# Patient Record
Sex: Male | Born: 1995
Health system: Southern US, Community
[De-identification: ages and names within clinical notes are randomized; demographics above are authoritative.]

## PROBLEM LIST (undated history)

## (undated) DIAGNOSIS — J45909 Unspecified asthma, uncomplicated: Secondary | ICD-10-CM

## (undated) DIAGNOSIS — B20 Human immunodeficiency virus [HIV] disease: Secondary | ICD-10-CM

## (undated) DIAGNOSIS — D649 Anemia, unspecified: Secondary | ICD-10-CM

## (undated) HISTORY — DX: Human immunodeficiency virus (HIV) disease: B20

## (undated) HISTORY — DX: Anemia, unspecified: D64.9

## (undated) HISTORY — PX: NO PAST SURGERIES: SHX2092

---
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
## 2016-01-13 MED FILL — ADVAIR 100/50 DISKUS: 100-50 | 90 days supply | Qty: 180 | Fill #0

## 2016-07-27 DIAGNOSIS — Z0100 Encounter for examination of eyes and vision without abnormal findings: Secondary | ICD-10-CM | POA: Diagnosis not present

## 2016-07-27 DIAGNOSIS — Z0000 Encounter for general adult medical examination without abnormal findings: Secondary | ICD-10-CM | POA: Diagnosis not present

## 2016-07-27 DIAGNOSIS — Z0110 Encounter for examination of ears and hearing without abnormal findings: Secondary | ICD-10-CM | POA: Diagnosis not present

## 2016-11-03 MED FILL — CHLORHEXIDINE 0.12% RINSE: 0.12 | 15 days supply | Qty: 473 | Fill #0

## 2016-11-04 MED FILL — ADVAIR 100/50 DISKUS: 100-50 | 90 days supply | Qty: 180 | Fill #1

## 2017-03-23 MED FILL — ADVAIR 100/50 DISKUS: 100-50 | 90 days supply | Qty: 180 | Fill #0

## 2017-03-29 DIAGNOSIS — H47091 Other disorders of optic nerve, not elsewhere classified, right eye: Secondary | ICD-10-CM | POA: Diagnosis not present

## 2017-03-29 DIAGNOSIS — H5213 Myopia, bilateral: Secondary | ICD-10-CM | POA: Diagnosis not present

## 2017-03-29 DIAGNOSIS — H52222 Regular astigmatism, left eye: Secondary | ICD-10-CM | POA: Diagnosis not present

## 2018-02-02 DIAGNOSIS — Z23 Encounter for immunization: Secondary | ICD-10-CM | POA: Diagnosis not present

## 2018-02-02 DIAGNOSIS — Z1389 Encounter for screening for other disorder: Secondary | ICD-10-CM | POA: Diagnosis not present

## 2018-02-02 DIAGNOSIS — Z0000 Encounter for general adult medical examination without abnormal findings: Secondary | ICD-10-CM | POA: Diagnosis not present

## 2018-02-02 MED FILL — ADVAIR 100/50 DISKUS: 100-50 | 30 days supply | Qty: 60 | Fill #0

## 2018-03-17 DIAGNOSIS — H6123 Impacted cerumen, bilateral: Secondary | ICD-10-CM | POA: Diagnosis not present

## 2018-03-17 DIAGNOSIS — Z0000 Encounter for general adult medical examination without abnormal findings: Secondary | ICD-10-CM | POA: Diagnosis not present

## 2018-03-17 DIAGNOSIS — J45909 Unspecified asthma, uncomplicated: Secondary | ICD-10-CM | POA: Diagnosis not present

## 2018-03-17 DIAGNOSIS — Z23 Encounter for immunization: Secondary | ICD-10-CM | POA: Diagnosis not present

## 2018-04-04 MED FILL — ADVAIR 100/50 DISKUS: 100-50 | 30 days supply | Qty: 60 | Fill #1

## 2018-04-26 DIAGNOSIS — H5213 Myopia, bilateral: Secondary | ICD-10-CM | POA: Diagnosis not present

## 2018-05-22 MED FILL — ADVAIR 100/50 DISKUS: 100-50 | 30 days supply | Qty: 60 | Fill #2

## 2018-06-21 DIAGNOSIS — H16042 Marginal corneal ulcer, left eye: Secondary | ICD-10-CM | POA: Diagnosis not present

## 2018-06-22 MED FILL — ZYLET EYE DROPS: 0.5-0.3 | 25 days supply | Qty: 5 | Fill #0

## 2018-08-08 MED FILL — ADVAIR 100/50 DISKUS: 100-50 | 30 days supply | Qty: 60 | Fill #3

## 2018-10-25 MED FILL — ADVAIR 100/50 DISKUS: 100-50 | 30 days supply | Qty: 60 | Fill #0

## 2018-10-26 DIAGNOSIS — J0100 Acute maxillary sinusitis, unspecified: Secondary | ICD-10-CM | POA: Diagnosis not present

## 2018-10-26 DIAGNOSIS — R6889 Other general symptoms and signs: Secondary | ICD-10-CM | POA: Diagnosis not present

## 2018-10-26 DIAGNOSIS — I889 Nonspecific lymphadenitis, unspecified: Secondary | ICD-10-CM | POA: Diagnosis not present

## 2018-10-27 MED FILL — AMOXICILLIN 500 MG CAPSULE: 500 | 7 days supply | Qty: 21 | Fill #0

## 2018-11-02 ENCOUNTER — Other Ambulatory Visit: Payer: Self-pay

## 2018-11-02 ENCOUNTER — Emergency Department (HOSPITAL_BASED_OUTPATIENT_CLINIC_OR_DEPARTMENT_OTHER): Payer: 59

## 2018-11-02 ENCOUNTER — Encounter (HOSPITAL_BASED_OUTPATIENT_CLINIC_OR_DEPARTMENT_OTHER): Payer: Self-pay | Admitting: *Deleted

## 2018-11-02 ENCOUNTER — Emergency Department (HOSPITAL_BASED_OUTPATIENT_CLINIC_OR_DEPARTMENT_OTHER)
Admission: EM | Admit: 2018-11-02 | Discharge: 2018-11-03 | Disposition: A | Discharge status: Home / Self Care | Payer: 59 | Attending: Emergency Medicine | Admitting: Emergency Medicine

## 2018-11-02 DIAGNOSIS — R509 Fever, unspecified: Secondary | ICD-10-CM | POA: Insufficient documentation

## 2018-11-02 DIAGNOSIS — F17200 Nicotine dependence, unspecified, uncomplicated: Secondary | ICD-10-CM | POA: Insufficient documentation

## 2018-11-02 DIAGNOSIS — J45909 Unspecified asthma, uncomplicated: Secondary | ICD-10-CM | POA: Diagnosis not present

## 2018-11-02 HISTORY — DX: Unspecified asthma, uncomplicated: J45.909

## 2018-11-02 LAB — COMPREHENSIVE METABOLIC PANEL
ALT: 49 U/L — ABNORMAL HIGH (ref 0–44)
AST: 48 U/L — ABNORMAL HIGH (ref 15–41)
Albumin: 3.6 g/dL (ref 3.5–5.0)
Alkaline Phosphatase: 53 U/L (ref 38–126)
Anion gap: 6 (ref 5–15)
BUN: 10 mg/dL (ref 6–20)
CO2: 26 mmol/L (ref 22–32)
Calcium: 8.7 mg/dL — ABNORMAL LOW (ref 8.9–10.3)
Chloride: 100 mmol/L (ref 98–111)
Creatinine, Ser: 0.95 mg/dL (ref 0.61–1.24)
GFR calc Af Amer: >60 mL/min (ref >60)
GFR calc non Af Amer: >60 mL/min (ref >60)
Glucose, Bld: 93 mg/dL (ref 70–99)
Potassium: 3.2 mmol/L — ABNORMAL LOW (ref 3.5–5.1)
Sodium: 132 mmol/L — ABNORMAL LOW (ref 135–145)
Total Bilirubin: 0.6 mg/dL (ref 0.3–1.2)
Total Protein: 6.6 g/dL (ref 6.5–8.1)

## 2018-11-02 LAB — URINALYSIS, ROUTINE W REFLEX MICROSCOPIC
Glucose, UA: NEGATIVE mg/dL
Hgb urine dipstick: NEGATIVE
Ketones, ur: 40 mg/dL — AB
Leukocytes, UA: NEGATIVE
Nitrite: NEGATIVE
Protein, ur: NEGATIVE mg/dL
Specific Gravity, Urine: 1.015 (ref 1.005–1.030)
pH: 5.5 (ref 5.0–8.0)

## 2018-11-02 LAB — CBC WITH DIFFERENTIAL/PLATELET
Abs Immature Granulocytes: 0.03 10*3/uL (ref 0.00–0.07)
Basophils Absolute: 0 10*3/uL (ref 0.0–0.1)
Basophils Relative: 1 %
Eosinophils Absolute: 0.1 10*3/uL (ref 0.0–0.5)
Eosinophils Relative: 4 %
HCT: 40.5 % (ref 39.0–52.0)
Hemoglobin: 12.5 g/dL — ABNORMAL LOW (ref 13.0–17.0)
Immature Granulocytes: 1 %
Lymphocytes Relative: 32 %
Lymphs Abs: 0.8 10*3/uL (ref 0.7–4.0)
MCH: 28.5 pg (ref 26.0–34.0)
MCHC: 30.9 g/dL (ref 30.0–36.0)
MCV: 92.5 fL (ref 80.0–100.0)
Monocytes Absolute: 0.2 10*3/uL (ref 0.1–1.0)
Monocytes Relative: 6 %
Neutro Abs: 1.5 10*3/uL — ABNORMAL LOW (ref 1.7–7.7)
Neutrophils Relative %: 56 %
Platelet Morphology: NORMAL
Platelets: 305 10*3/uL (ref 150–400)
RBC: 4.38 MIL/uL (ref 4.22–5.81)
RDW: 10.9 % — ABNORMAL LOW (ref 11.5–15.5)
WBC: 2.6 10*3/uL — ABNORMAL LOW (ref 4.0–10.5)
nRBC: 0 % (ref 0.0–0.2)

## 2018-11-02 LAB — CK: Total CK: 71 U/L (ref 49–397)

## 2018-11-02 MED ORDER — ACETAMINOPHEN 325 MG PO TABS
650.0000 mg | ORAL_TABLET | Freq: Once | ORAL | Status: AC
  Administered 2018-11-02: 650 mg via ORAL
  Filled 2018-11-02: qty 2

## 2018-11-02 MED ORDER — POTASSIUM CHLORIDE CRYS ER 20 MEQ PO TBCR
40.0000 meq | EXTENDED_RELEASE_TABLET | Freq: Once | ORAL | Status: AC
  Administered 2018-11-03: 40 meq via ORAL
  Filled 2018-11-02: qty 2

## 2018-11-02 MED ORDER — KETOROLAC TROMETHAMINE 15 MG/ML IJ SOLN
15.0000 mg | Freq: Once | INTRAMUSCULAR | Status: AC
  Administered 2018-11-02: 15 mg via INTRAVENOUS
  Filled 2018-11-02: qty 1

## 2018-11-02 MED ORDER — SODIUM CHLORIDE 0.9 % IV BOLUS
1000.0000 mL | Freq: Once | INTRAVENOUS | Status: AC
  Administered 2018-11-02: 1000 mL via INTRAVENOUS

## 2018-11-02 NOTE — ED Provider Notes (Signed)
[facility] EMERGENCY DEPARTMENT Provider Note   CSN: [id number] Arrival date & time: 11/02/18  [id number]     History   Chief Complaint Chief Complaint  Patient presents with  . Abdominal Pain    HPI Annye RuskLevon Flegel is a 22 y.o. male.  HPI   22 year old male with fever, generalized fatigue body aches.  He reports that symptoms began actually over 2 weeks ago.  This is his evaluated by his PCP and prescribed amoxicillin.  He is almost done with the course of this with no improvement of symptoms.  Occasionally has a "bubbling feeling" in his abdomen.  This improves with burping.  Does not really feel nauseated.  No vomiting or diarrhea.  No rash.  No weight loss.  No cough or shortness of breath.  Denies myalgias.  No weight loss.  History of asthma, otherwise healthy.  He has never been incarcerated.  Denies any past history of IV drug use.  Past Medical History:  Diagnosis Date  . Asthma     There are no active problems to display for this patient.   History reviewed. No pertinent surgical history.      Home Medications    Prior to Admission medications   Medication Sig Start Date End Date Taking? Authorizing Provider  Fluticasone-Salmeterol (ADVAIR DISKUS IN) Inhale into the lungs.   Yes [provider]    Family History No family history on file.  Social History Social History   Tobacco Use  . Smoking status: Current Every Day Smoker  . Smokeless tobacco: Never Used  Substance Use Topics  . Alcohol use: Never    Frequency: Never  . Drug use: Never     Allergies   Patient has no known allergies.   Review of Systems Review of Systems  All systems reviewed and negative, other than as noted in HPI.  Physical Exam Updated Vital Signs BP 118/64 (BP Location: Right Arm)   Pulse 83   Temp 100.2 F (37.9 C) (Oral)   Resp 18   Ht 5\' 8"  (1.727 m)   Wt 65 kg   SpO2 100%   BMI 21.79 kg/m   Physical Exam Vitals signs and nursing  note reviewed.  Constitutional:      General: He is not in acute distress.    Appearance: He is well-developed. He is not ill-appearing or toxic-appearing.  HENT:     Head: Normocephalic and atraumatic.  Eyes:     General:        Right eye: No discharge.        Left eye: No discharge.     Conjunctiva/sclera: Conjunctivae normal.  Neck:     Musculoskeletal: Neck supple.  Cardiovascular:     Rate and Rhythm: Regular rhythm.     Heart sounds: Normal heart sounds. No murmur. No friction rub. No gallop.      Comments: Mild tachycardia Pulmonary:     Effort: Pulmonary effort is normal. No respiratory distress.     Breath sounds: Normal breath sounds.  Abdominal:     General: There is no distension.     Palpations: Abdomen is soft.     Tenderness: There is no abdominal tenderness.  Musculoskeletal:        General: No tenderness.  Skin:    General: Skin is warm and dry.  Neurological:     Mental Status: He is alert.  Psychiatric:        Behavior: Behavior normal.  Thought Content: Thought content normal.      ED Treatments / Results  Labs (all labs ordered are listed, but only abnormal results are displayed) Labs Reviewed  URINALYSIS, ROUTINE W REFLEX MICROSCOPIC - Abnormal; Notable for the following components:      Result Value   Color, Urine ORANGE (*)    Bilirubin Urine SMALL (*)    Ketones, ur 40 (*)    All other components within normal limits  CBC WITH DIFFERENTIAL/PLATELET - Abnormal; Notable for the following components:   WBC 2.6 (*)    Hemoglobin 12.5 (*)    RDW 10.9 (*)    Neutro Abs 1.5 (*)    All other components within normal limits  COMPREHENSIVE METABOLIC PANEL - Abnormal; Notable for the following components:   Sodium 132 (*)    Potassium 3.2 (*)    Calcium 8.7 (*)    AST 48 (*)    ALT 49 (*)    All other components within normal limits  CULTURE, BLOOD (ROUTINE X 2)  CULTURE, BLOOD (ROUTINE X 2)  INFLUENZA PANEL BY PCR (TYPE A & B)  CK    RHEUMATOID FACTOR  SEDIMENTATION RATE  C-REACTIVE PROTEIN  ANTINUCLEAR ANTIBODIES, IFA  HIV ANTIBODY (ROUTINE TESTING W REFLEX)    EKG None  Radiology Dg Chest 2 View  Result Date: 11/02/2018 CLINICAL DATA:  Fever for 2 weeks, abdominal pain and headache. EXAM: CHEST - 2 VIEW COMPARISON:  None. FINDINGS: Cardiomediastinal silhouette is normal. No pleural effusions or focal consolidations. Trachea projects midline and there is no pneumothorax. Soft tissue planes and included osseous structures are non-suspicious. Mild lower thoracic levoscoliosis. IMPRESSION: Negative. Electronically Signed   By: Awilda Metroourtnay  Bloomer M.D.   On: 11/02/2018 23:07    Procedures Procedures (including critical care time)  Medications Ordered in ED Medications  sodium chloride 0.9 % bolus 1,000 mL (1,000 mLs Intravenous New Bag/Given 11/02/18 2301)  acetaminophen (TYLENOL) tablet 650 mg (650 mg Oral Given 11/02/18 2120)  ketorolac (TORADOL) 15 MG/ML injection 15 mg (15 mg Intravenous Given 11/02/18 2300)     Initial Impression / Assessment and Plan / ED Course  I have reviewed the triage vital signs and the nursing notes.  Pertinent labs & imaging results that were available during my care of the patient were reviewed by me and considered in my medical decision making (see chart for details).     22yM with fever for over two weeks. Actually looks quite well. Finished course of abx without improvement. Unclear source of fever. Basic labs unremarkable. Additional studies sent. PT understands the need ot have these followed up.   It has been determined that no acute conditions requiring further emergency intervention are present at this time. The patient has been advised of the diagnosis and plan. I reviewed any labs and imaging including any potential incidental findings. We have discussed signs and symptoms that warrant return to the ED and they are listed in the discharge instructions.     Final  Clinical Impressions(s) / ED Diagnoses   Final diagnoses:  Fever, unspecified fever cause    ED Discharge Orders    None       Raeford RazorKohut, Sandhya Denherder, MD 11/04/18 [phone]

## 2018-11-02 NOTE — ED Triage Notes (Signed)
Abdominal pain and headache for over a week. He was seen by his MD and took a round of antibiotics with no improvement. He has had a negative flu test.

## 2018-11-03 DIAGNOSIS — F17200 Nicotine dependence, unspecified, uncomplicated: Secondary | ICD-10-CM | POA: Diagnosis not present

## 2018-11-03 DIAGNOSIS — J45909 Unspecified asthma, uncomplicated: Secondary | ICD-10-CM | POA: Diagnosis not present

## 2018-11-03 DIAGNOSIS — R509 Fever, unspecified: Secondary | ICD-10-CM | POA: Diagnosis not present

## 2018-11-03 LAB — INFLUENZA PANEL BY PCR (TYPE A & B)
Influenza A By PCR: NEGATIVE
Influenza B By PCR: NEGATIVE

## 2018-11-03 LAB — SEDIMENTATION RATE: Sed Rate: 8 mm/hr (ref 0–16)

## 2018-11-03 LAB — C-REACTIVE PROTEIN: CRP: <0.8 mg/dL (ref <1.0)

## 2018-11-04 LAB — RHEUMATOID FACTOR: Rheumatoid fact SerPl-aCnc: <10 IU/mL (ref 0.0–13.9)

## 2018-11-06 LAB — ANTINUCLEAR ANTIBODIES, IFA: ANA Ab, IFA: NEGATIVE

## 2018-11-08 LAB — CULTURE, BLOOD (ROUTINE X 2)
Culture: NO GROWTH
Culture: NO GROWTH
Special Requests: ADEQUATE
Special Requests: ADEQUATE

## 2018-11-09 ENCOUNTER — Ambulatory Visit
Admission: RE | Admit: 2018-11-09 | Discharge: 2018-11-09 | Disposition: A | Discharge status: Home / Self Care | Payer: 59 | Source: Ambulatory Visit | Attending: Internal Medicine | Admitting: Internal Medicine

## 2018-11-09 ENCOUNTER — Emergency Department (HOSPITAL_COMMUNITY)
Admission: EM | Admit: 2018-11-09 | Discharge: 2018-11-10 | Disposition: A | Discharge status: Home / Self Care | Payer: No Typology Code available for payment source | Attending: Emergency Medicine | Admitting: Emergency Medicine

## 2018-11-09 ENCOUNTER — Other Ambulatory Visit: Payer: Self-pay | Admitting: Internal Medicine

## 2018-11-09 ENCOUNTER — Encounter (HOSPITAL_COMMUNITY): Payer: Self-pay

## 2018-11-09 DIAGNOSIS — R109 Unspecified abdominal pain: Secondary | ICD-10-CM

## 2018-11-09 DIAGNOSIS — Z5321 Procedure and treatment not carried out due to patient leaving prior to being seen by health care provider: Secondary | ICD-10-CM | POA: Insufficient documentation

## 2018-11-09 DIAGNOSIS — R531 Weakness: Secondary | ICD-10-CM | POA: Diagnosis present

## 2018-11-09 LAB — CBC
HCT: 32.8 % — ABNORMAL LOW (ref 39.0–52.0)
Hemoglobin: 11.4 g/dL — ABNORMAL LOW (ref 13.0–17.0)
MCH: 33.2 pg (ref 26.0–34.0)
MCHC: 34.8 g/dL (ref 30.0–36.0)
MCV: 95.6 fL (ref 80.0–100.0)
PLATELETS: 316 10*3/uL (ref 150–400)
RBC: 3.43 MIL/uL — ABNORMAL LOW (ref 4.22–5.81)
RDW: 11.5 % (ref 11.5–15.5)
WBC: 5.5 10*3/uL (ref 4.0–10.5)
nRBC: 0.4 % — ABNORMAL HIGH (ref 0.0–0.2)

## 2018-11-09 LAB — COMPREHENSIVE METABOLIC PANEL
ALT: 739 U/L — ABNORMAL HIGH (ref 0–44)
AST: 569 U/L — ABNORMAL HIGH (ref 15–41)
Albumin: 3.6 g/dL (ref 3.5–5.0)
Alkaline Phosphatase: 89 U/L (ref 38–126)
Anion gap: 11 (ref 5–15)
BUN: 12 mg/dL (ref 6–20)
CALCIUM: 9.1 mg/dL (ref 8.9–10.3)
CO2: 25 mmol/L (ref 22–32)
Chloride: 102 mmol/L (ref 98–111)
Creatinine, Ser: 0.95 mg/dL (ref 0.61–1.24)
GFR calc Af Amer: >60 mL/min (ref >60)
GFR calc non Af Amer: >60 mL/min (ref >60)
Glucose, Bld: 99 mg/dL (ref 70–99)
Potassium: 4.6 mmol/L (ref 3.5–5.1)
Sodium: 138 mmol/L (ref 135–145)
Total Bilirubin: 1.7 mg/dL — ABNORMAL HIGH (ref 0.3–1.2)
Total Protein: 7.3 g/dL (ref 6.5–8.1)

## 2018-11-09 LAB — URINALYSIS, ROUTINE W REFLEX MICROSCOPIC
Bilirubin Urine: NEGATIVE
Glucose, UA: NEGATIVE mg/dL
Hgb urine dipstick: NEGATIVE
Ketones, ur: 5 mg/dL — AB
Leukocytes, UA: NEGATIVE
Nitrite: NEGATIVE
Protein, ur: NEGATIVE mg/dL
Specific Gravity, Urine: 1.019 (ref 1.005–1.030)
pH: 5 (ref 5.0–8.0)

## 2018-11-09 LAB — LIPASE, BLOOD: Lipase: 29 U/L (ref 11–51)

## 2018-11-09 NOTE — ED Triage Notes (Signed)
Pt reports continued generalized weakness and nausea fot the past 3 weeks. Was seen by PCP today and was sent here due to elevated liver function tests, possible hepatitis. Pt endorsing fevers at home and some diarrhea.

## 2018-11-10 ENCOUNTER — Emergency Department (HOSPITAL_COMMUNITY)
Admission: EM | Admit: 2018-11-10 | Discharge: 2018-11-10 | Disposition: A | Discharge status: Home / Self Care | Payer: No Typology Code available for payment source | Source: Home / Self Care | Attending: Emergency Medicine | Admitting: Emergency Medicine

## 2018-11-10 ENCOUNTER — Ambulatory Visit (HOSPITAL_COMMUNITY): Payer: No Typology Code available for payment source

## 2018-11-10 ENCOUNTER — Telehealth (HOSPITAL_BASED_OUTPATIENT_CLINIC_OR_DEPARTMENT_OTHER): Payer: Self-pay | Admitting: *Deleted

## 2018-11-10 DIAGNOSIS — R7401 Elevation of levels of liver transaminase levels: Secondary | ICD-10-CM

## 2018-11-10 DIAGNOSIS — R740 Nonspecific elevation of levels of transaminase and lactic acid dehydrogenase [LDH]: Secondary | ICD-10-CM

## 2018-11-10 DIAGNOSIS — B20 Human immunodeficiency virus [HIV] disease: Secondary | ICD-10-CM

## 2018-11-10 LAB — CBC WITH DIFFERENTIAL/PLATELET
Abs Immature Granulocytes: 0 10*3/uL (ref 0.00–0.07)
Basophils Absolute: 0 10*3/uL (ref 0.0–0.1)
Basophils Relative: 0 %
Eosinophils Absolute: 0 10*3/uL (ref 0.0–0.5)
Eosinophils Relative: 0 %
HEMATOCRIT: 33.9 % — AB (ref 39.0–52.0)
Hemoglobin: 11.2 g/dL — ABNORMAL LOW (ref 13.0–17.0)
Lymphocytes Relative: 48 %
Lymphs Abs: 2.4 10*3/uL (ref 0.7–4.0)
MCH: 31.6 pg (ref 26.0–34.0)
MCHC: 33 g/dL (ref 30.0–36.0)
MCV: 95.8 fL (ref 80.0–100.0)
MONOS PCT: 9 %
Monocytes Absolute: 0.5 10*3/uL (ref 0.1–1.0)
Neutro Abs: 2.2 10*3/uL (ref 1.7–7.7)
Neutrophils Relative %: 43 %
Platelets: 304 10*3/uL (ref 150–400)
RBC: 3.54 MIL/uL — ABNORMAL LOW (ref 4.22–5.81)
RDW: 11.5 % (ref 11.5–15.5)
WBC: 5.1 10*3/uL (ref 4.0–10.5)
nRBC: 0.6 % — ABNORMAL HIGH (ref 0.0–0.2)

## 2018-11-10 LAB — URINALYSIS, ROUTINE W REFLEX MICROSCOPIC
Bilirubin Urine: NEGATIVE
Glucose, UA: NEGATIVE mg/dL
Hgb urine dipstick: NEGATIVE
KETONES UR: 5 mg/dL — AB
Leukocytes, UA: NEGATIVE
Nitrite: NEGATIVE
Protein, ur: NEGATIVE mg/dL
Specific Gravity, Urine: 1.019 (ref 1.005–1.030)
pH: 5 (ref 5.0–8.0)

## 2018-11-10 LAB — COMPREHENSIVE METABOLIC PANEL
ALT: 822 U/L — ABNORMAL HIGH (ref 0–44)
AST: 656 U/L — ABNORMAL HIGH (ref 15–41)
Albumin: 3.5 g/dL (ref 3.5–5.0)
Alkaline Phosphatase: 95 U/L (ref 38–126)
Anion gap: 9 (ref 5–15)
BILIRUBIN TOTAL: 1.8 mg/dL — AB (ref 0.3–1.2)
BUN: 12 mg/dL (ref 6–20)
CO2: 25 mmol/L (ref 22–32)
Calcium: 8.8 mg/dL — ABNORMAL LOW (ref 8.9–10.3)
Chloride: 101 mmol/L (ref 98–111)
Creatinine, Ser: 0.96 mg/dL (ref 0.61–1.24)
Glucose, Bld: 129 mg/dL — ABNORMAL HIGH (ref 70–99)
Potassium: 3.6 mmol/L (ref 3.5–5.1)
Sodium: 135 mmol/L (ref 135–145)
TOTAL PROTEIN: 7 g/dL (ref 6.5–8.1)

## 2018-11-10 LAB — HIV ANTIBODY (ROUTINE TESTING W REFLEX): HIV Screen 4th Generation wRfx: REACTIVE — AB

## 2018-11-10 LAB — LIPASE, BLOOD: Lipase: 38 U/L (ref 11–51)

## 2018-11-10 LAB — ACETAMINOPHEN LEVEL: Acetaminophen (Tylenol), Serum: <10 ug/mL — ABNORMAL LOW (ref 10–30)

## 2018-11-10 LAB — HIV 1/2 AB DIFFERENTIATION
HIV 1 Ab: UNDETERMINED
HIV 2 Ab: NEGATIVE

## 2018-11-10 MED ORDER — SODIUM CHLORIDE 0.9 % IV SOLN
1000.0000 mL | Freq: Once | INTRAVENOUS | Status: AC
  Administered 2018-11-10: 1000 mL via INTRAVENOUS

## 2018-11-10 NOTE — ED Notes (Signed)
Patient transported to Ultrasound 

## 2018-11-10 NOTE — ED Notes (Signed)
Patient verbalizes understanding of discharge instructions. Opportunity for questioning and answers were provided. Pt discharged from ED. 

## 2018-11-10 NOTE — Discharge Instructions (Addendum)
You have been diagnosed with HIV.  Infectious disease specialist Dr. Orvan Falconer will contact you next week for further management once confirmatory test has resulted.  Your liver enzyme is elevated and likely due to HIV infection.  Avoid alcohol or tylenol as it can worsen your condition.  Return if you have any concerns.  Follow up with your primary care provider.

## 2018-11-10 NOTE — ED Triage Notes (Signed)
Pt here from Md office with elevated liver test , pt was here last night but left due to wait

## 2018-11-10 NOTE — ED Notes (Signed)
Pt called x 3 with no answer

## 2018-11-10 NOTE — ED Provider Notes (Signed)
[facility] Provider Note   CSN: [id number] Arrival date & time: 11/10/18  1119     History   Chief Complaint No chief complaint on file.   HPI Douglas Jimenez is a 23 y.o. male.  The history is provided by the patient and medical records. No language interpreter was used.     23 year old male sent here by PCP for abnormal labs.  Patient report 3 weeks ago he developed fever and generalized body aches.  He was initially seen by his PCP and was given amoxicillin.  He finished a full course but states it did not provide any significant improvement.  He was seen in the ED on 12/26 with complaints of abdominal discomfort.  No acute pathology was identified however broad panel of labs was sent.  Patient follow-up with PCP yesterday when he still endorse no improvement of his symptoms.  He was found to have elevated liver enzyme and was recommended to come to ER to be admitted.  He went to the ED yesterday but left without being seen due to the long wait.  His labs liver function worsen and patient returns today for recommendation by PCP to get admitted.  Aside from generalized weakness, patient denies any severe headache, no URI symptoms, no chest pain, shortness of breath, productive cough, abdominal discomfort.  He endorsed mild nausea without vomiting.  He does complain of itchiness throughout his skin.  He denies alcohol abuse.  No recent travel.  No complaints of severe headache.  He does admits to taking Motrin, Benadryl, and Tylenol to help with his symptoms but denies taking more Tylenol than recommended.  Pt does admits to having sexual activities with both sexes.   Past Medical History:  Diagnosis Date  . Asthma     There are no active problems to display for this patient.   No past surgical history on file.      Home Medications    Prior to Admission medications   Medication Sig Start Date End Date Taking? Authorizing Provider    Fluticasone-Salmeterol (ADVAIR DISKUS IN) Inhale into the lungs.    [provider]    Family History No family history on file.  Social History Social History   Tobacco Use  . Smoking status: Current Every Day Smoker  . Smokeless tobacco: Never Used  Substance Use Topics  . Alcohol use: Never    Frequency: Never  . Drug use: Never     Allergies   Amoxicillin   Review of Systems Review of Systems  All other systems reviewed and are negative.    Physical Exam Updated Vital Signs BP 113/67   Pulse 96   Temp 98.8 F (37.1 C)   Resp 16   SpO2 100%   Physical Exam Vitals signs and nursing note reviewed.  Constitutional:      General: He is not in acute distress.    Appearance: He is well-developed.  HENT:     Head: Atraumatic.  Eyes:     General: No scleral icterus.    Conjunctiva/sclera: Conjunctivae normal.  Neck:     Musculoskeletal: Neck supple. No neck rigidity.  Cardiovascular:     Rate and Rhythm: Normal rate and regular rhythm.     Pulses: Normal pulses.     Heart sounds: Normal heart sounds.  Pulmonary:     Effort: Pulmonary effort is normal.     Breath sounds: No wheezing.  Abdominal:     Palpations: Abdomen is soft.  Tenderness: There is no abdominal tenderness.  Skin:    Findings: No rash.  Neurological:     Mental Status: He is alert and oriented to person, place, and time.  Psychiatric:        Mood and Affect: Mood normal.      ED Treatments / Results  Labs (all labs ordered are listed, but only abnormal results are displayed) Labs Reviewed  CBC WITH DIFFERENTIAL/PLATELET - Abnormal; Notable for the following components:      Result Value   RBC 3.54 (*)    Hemoglobin 11.2 (*)    HCT 33.9 (*)    nRBC 0.6 (*)    All other components within normal limits  COMPREHENSIVE METABOLIC PANEL - Abnormal; Notable for the following components:   Glucose, Bld 129 (*)    Calcium 8.8 (*)    AST 656 (*)    ALT 822 (*)     Total Bilirubin 1.8 (*)    All other components within normal limits  URINALYSIS, ROUTINE W REFLEX MICROSCOPIC - Abnormal; Notable for the following components:   Color, Urine AMBER (*)    Ketones, ur 5 (*)    All other components within normal limits  ACETAMINOPHEN LEVEL - Abnormal; Notable for the following components:   Acetaminophen (Tylenol), Serum <10 (*)    All other components within normal limits  LIPASE, BLOOD  HEPATITIS PANEL, ACUTE  HIV-1 RNA, QUALITATIVE, TMA    EKG None  Radiology Dg Abd 2 Views  Result Date: 11/09/2018 CLINICAL DATA:  Acute generalized abdominal pain. EXAM: ABDOMEN - 2 VIEW COMPARISON:  None. FINDINGS: The bowel gas pattern is normal. There is no evidence of free air. No radio-opaque calculi or other significant radiographic abnormality is seen. IMPRESSION: No evidence of bowel obstruction or ileus. Electronically Signed   By: Lupita RaiderJames  Green Jr, M.D.   On: 11/09/2018 11:19    Procedures Procedures (including critical care time)  Medications Ordered in ED Medications  0.9 %  sodium chloride infusion (1,000 mLs Intravenous New Bag/Given 11/10/18 1417)     Initial Impression / Assessment and Plan / ED Course  I have reviewed the triage vital signs and the nursing notes.  Pertinent labs & imaging results that were available during my care of the patient were reviewed by me and considered in my medical decision making (see chart for details).     BP 96/71 (BP Location: Left Arm)   Pulse 88   Temp 98.8 F (37.1 C)   Resp 16   Ht 5\' 8"  (1.727 m)   Wt 62.6 kg   SpO2 100%   BMI 20.98 kg/m    Final Clinical Impressions(s) / ED Diagnoses   Final diagnoses:  Symptomatic HIV infection (HCC)  Transaminitis    ED Discharge Orders    None     2:01 PM Patient sent here by PCP due to elevated liver enzymes in the 300s.  He does not have any significant complaint.  He did report having fevers and myalgias several weeks prior as well as some  abdominal discomfort.  He still endorsed generalized weakness but no vomiting or diarrhea.  I have reviewed his prior visits, and patient had an HIV test that was performed 8 days ago which is reactive.  He was seen yesterday in the ED but left without being seen.  Labs remarkable for elevated transaminitis in the 500s.  This finding is concerning for hepatitis, will send hepatitis panel, I will also check Tylenol level.  Patient may need to be admitted for further work-up.  3:31 PM Appreciate consultation with infectious disease specialist Dr. Orvan Falconer who will f/u with pt next week.  I discussed finding with pt and dad who is at bedside in regard to the HIV status.  Due to rising liver enzyme, family request admission for close monitoring.  Plan to also obtain abd limited US to r/o obstructive pathology causing transaminitis.   5:12 PM UA without signs of urinary tract infection, CBC with differential showed reactive benign lymphocyte as well as burr cells.  Worsening transaminitis with AST 63 6, ALT 822 and total bili of 1.8.  Tylenol level was normal.  Limited abdominal ultrasound show no concerning changes.  I discussed this finding with Dr. Madilyn Hook.  At this time, we felt that the patient does not necessarily benefit from hospital admission asked he is well-appearing, not actively vomiting, and vital signs stable.  Encourage patient to follow-up with PCP, and infectious disease specialist, Dr. Orvan Falconer will reach out to have patient follow-up for further management which will include PEP.  Suspect primary HIV causing transaminitis. Encourage pt to avoid alcohol or tylenol as his liver is impaired.     Fayrene Helper, PA-C 11/10/18 1721    Tilden Fossa, MD 11/13/18 [phone]

## 2018-11-11 LAB — HEPATITIS PANEL, ACUTE
HCV Ab: 0.1 s/co ratio (ref 0.0–0.9)
HEP A IGM: NEGATIVE
Hep B C IgM: NEGATIVE
Hepatitis B Surface Ag: NEGATIVE

## 2018-11-13 LAB — PATHOLOGIST SMEAR REVIEW: Path Review: REACTIVE

## 2018-11-13 LAB — RNA QUALITATIVE: HIV 1 RNA Qualitative: UNDETERMINED

## 2018-11-13 LAB — HIV-1 RNA, QUALITATIVE, TMA: HIV-1 RNA, Qualitative, TMA: POSITIVE — AB

## 2018-11-14 ENCOUNTER — Telehealth: Payer: Self-pay | Admitting: *Deleted

## 2018-11-14 NOTE — Telephone Encounter (Signed)
Information sent to [facility] for follow up. 

## 2018-11-27 ENCOUNTER — Other Ambulatory Visit (HOSPITAL_COMMUNITY)
Admission: RE | Admit: 2018-11-27 | Discharge: 2018-11-27 | Disposition: A | Discharge status: Home / Self Care | Payer: No Typology Code available for payment source | Source: Ambulatory Visit | Attending: Infectious Diseases | Admitting: Infectious Diseases

## 2018-11-27 ENCOUNTER — Other Ambulatory Visit: Payer: Self-pay | Admitting: Behavioral Health

## 2018-11-27 ENCOUNTER — Ambulatory Visit: Payer: No Typology Code available for payment source

## 2018-11-27 ENCOUNTER — Other Ambulatory Visit: Payer: No Typology Code available for payment source

## 2018-11-27 DIAGNOSIS — Z113 Encounter for screening for infections with a predominantly sexual mode of transmission: Secondary | ICD-10-CM | POA: Diagnosis present

## 2018-11-27 DIAGNOSIS — B20 Human immunodeficiency virus [HIV] disease: Secondary | ICD-10-CM | POA: Diagnosis not present

## 2018-11-27 DIAGNOSIS — Z79899 Other long term (current) drug therapy: Secondary | ICD-10-CM

## 2018-11-27 LAB — URINALYSIS
Bilirubin Urine: NEGATIVE
Glucose, UA: NEGATIVE
Hgb urine dipstick: NEGATIVE
Ketones, ur: NEGATIVE
LEUKOCYTES UA: NEGATIVE
Nitrite: NEGATIVE
Protein, ur: NEGATIVE
Specific Gravity, Urine: 1.018 (ref 1.001–1.03)
pH: 5.5 (ref 5.0–8.0)

## 2018-11-28 LAB — URINE CYTOLOGY ANCILLARY ONLY
Chlamydia: NEGATIVE
Neisseria Gonorrhea: NEGATIVE

## 2018-11-28 LAB — T-HELPER CELL (CD4) - (RCID CLINIC ONLY)
CD4 % Helper T Cell: 13 % — ABNORMAL LOW (ref 33–55)
CD4 T Cell Abs: 560 /uL (ref 400–2700)

## 2018-12-06 LAB — QUANTIFERON-TB GOLD PLUS
Mitogen-NIL: 4.72 IU/mL
NIL: 0.03 IU/mL
QUANTIFERON-TB GOLD PLUS: NEGATIVE
TB1-NIL: 0.01 IU/mL
TB2-NIL: 0.01 IU/mL

## 2018-12-06 LAB — CBC WITH DIFFERENTIAL/PLATELET
Absolute Monocytes: 481 cells/uL (ref 200–950)
BASOS PCT: 0.5 %
Basophils Absolute: 33 cells/uL (ref 0–200)
Eosinophils Absolute: 208 cells/uL (ref 15–500)
Eosinophils Relative: 3.2 %
HCT: 36.2 % — ABNORMAL LOW (ref 38.5–50.0)
HEMOGLOBIN: 11.7 g/dL — AB (ref 13.2–17.1)
Lymphs Abs: 4206 cells/uL — ABNORMAL HIGH (ref 850–3900)
MCH: 30.2 pg (ref 27.0–33.0)
MCHC: 32.3 g/dL (ref 32.0–36.0)
MCV: 93.5 fL (ref 80.0–100.0)
MPV: 9.3 fL (ref 7.5–12.5)
Monocytes Relative: 7.4 %
Neutro Abs: 1573 cells/uL (ref 1500–7800)
Neutrophils Relative %: 24.2 %
Platelets: 326 10*3/uL (ref 140–400)
RBC: 3.87 10*6/uL — ABNORMAL LOW (ref 4.20–5.80)
RDW: 13 % (ref 11.0–15.0)
Total Lymphocyte: 64.7 %
WBC: 6.5 10*3/uL (ref 3.8–10.8)

## 2018-12-06 LAB — HIV ANTIBODY (ROUTINE TESTING W REFLEX): HIV 1&2 Ab, 4th Generation: REACTIVE — AB

## 2018-12-06 LAB — COMPLETE METABOLIC PANEL WITH GFR
AG Ratio: 1.3 (calc) (ref 1.0–2.5)
ALT: 127 U/L — ABNORMAL HIGH (ref 9–46)
AST: 66 U/L — ABNORMAL HIGH (ref 10–40)
Albumin: 4.1 g/dL (ref 3.6–5.1)
Alkaline phosphatase (APISO): 80 U/L (ref 40–115)
BUN: 11 mg/dL (ref 7–25)
CO2: 26 mmol/L (ref 20–32)
Calcium: 9.5 mg/dL (ref 8.6–10.3)
Chloride: 107 mmol/L (ref 98–110)
Creat: 0.88 mg/dL (ref 0.60–1.35)
GFR, Est African American: 142 mL/min/{1.73_m2} (ref >=60)
GFR, Est Non African American: 123 mL/min/{1.73_m2} (ref >=60)
Globulin: 3.1 g/dL (calc) (ref 1.9–3.7)
Glucose, Bld: 91 mg/dL (ref 65–99)
POTASSIUM: 4.4 mmol/L (ref 3.5–5.3)
Sodium: 141 mmol/L (ref 135–146)
Total Bilirubin: 0.4 mg/dL (ref 0.2–1.2)
Total Protein: 7.2 g/dL (ref 6.1–8.1)

## 2018-12-06 LAB — HEPATITIS B SURFACE ANTIGEN: Hepatitis B Surface Ag: NONREACTIVE

## 2018-12-06 LAB — HIV-1 RNA ULTRAQUANT REFLEX TO GENTYP+
HIV 1 RNA QUANT: 1570000 {copies}/mL — AB
HIV-1 RNA Quant, Log: 6.2 Log copies/mL — ABNORMAL HIGH

## 2018-12-06 LAB — HEPATITIS B SURFACE ANTIBODY,QUALITATIVE: Hep B S Ab: REACTIVE — AB

## 2018-12-06 LAB — LIPID PANEL
Cholesterol: 156 mg/dL (ref <200)
HDL: 49 mg/dL (ref >40)
LDL Cholesterol (Calc): 85 mg/dL (calc)
Non-HDL Cholesterol (Calc): 107 mg/dL (calc) (ref <130)
Total CHOL/HDL Ratio: 3.2 (calc) (ref <5.0)
Triglycerides: 123 mg/dL (ref <150)

## 2018-12-06 LAB — RPR: RPR Ser Ql: NONREACTIVE

## 2018-12-06 LAB — HLA B*5701: HLA-B*5701 w/rflx HLA-B High: NEGATIVE

## 2018-12-06 LAB — HIV-1/2 AB - DIFFERENTIATION
HIV 2 AB: NEGATIVE
HIV-1 antibody: POSITIVE — AB

## 2018-12-06 LAB — HEPATITIS B CORE ANTIBODY, TOTAL: Hep B Core Total Ab: NONREACTIVE

## 2018-12-06 LAB — HEPATITIS A ANTIBODY, TOTAL: Hepatitis A AB,Total: REACTIVE — AB

## 2018-12-06 LAB — HEPATITIS C ANTIBODY
Hepatitis C Ab: NONREACTIVE
SIGNAL TO CUT-OFF: 0.08 (ref <1.00)

## 2018-12-06 LAB — HIV-1 GENOTYPE: HIV-1 Genotype: DETECTED — AB

## 2018-12-12 ENCOUNTER — Telehealth: Payer: Self-pay | Admitting: Pharmacy Technician

## 2018-12-12 ENCOUNTER — Encounter: Payer: Self-pay | Admitting: Infectious Diseases

## 2018-12-12 ENCOUNTER — Ambulatory Visit (INDEPENDENT_AMBULATORY_CARE_PROVIDER_SITE_OTHER): Payer: No Typology Code available for payment source | Admitting: Pharmacist

## 2018-12-12 ENCOUNTER — Ambulatory Visit (INDEPENDENT_AMBULATORY_CARE_PROVIDER_SITE_OTHER): Payer: No Typology Code available for payment source | Admitting: Infectious Diseases

## 2018-12-12 VITALS — BP 131/79 | HR 98 | Temp 99.5°F | Ht 67.0 in | Wt 151.0 lb

## 2018-12-12 DIAGNOSIS — B20 Human immunodeficiency virus [HIV] disease: Secondary | ICD-10-CM | POA: Diagnosis not present

## 2018-12-12 DIAGNOSIS — D649 Anemia, unspecified: Secondary | ICD-10-CM

## 2018-12-12 DIAGNOSIS — Z21 Asymptomatic human immunodeficiency virus [HIV] infection status: Secondary | ICD-10-CM

## 2018-12-12 DIAGNOSIS — R740 Nonspecific elevation of levels of transaminase and lactic acid dehydrogenase [LDH]: Secondary | ICD-10-CM

## 2018-12-12 DIAGNOSIS — R7401 Elevation of levels of liver transaminase levels: Secondary | ICD-10-CM | POA: Insufficient documentation

## 2018-12-12 HISTORY — DX: Anemia, unspecified: D64.9

## 2018-12-12 HISTORY — DX: Human immunodeficiency virus (HIV) disease: B20

## 2018-12-12 HISTORY — DX: Asymptomatic human immunodeficiency virus (hiv) infection status: Z21

## 2018-12-12 MED ORDER — BICTEGRAVIR-EMTRICITAB-TENOFOV 50-200-25 MG PO TABS: 1.0000 | ORAL_TABLET | Freq: Every day | ORAL | 5 refills | Status: AC

## 2018-12-12 MED FILL — BIKTARVY 50-200-25 MG TABS: 50-200-25 | 30 days supply | Qty: 30 | Fill #0

## 2018-12-12 NOTE — Assessment & Plan Note (Signed)
New patient here to establish for HIV care. I discussed with Derion Clavin treatment options/side effects, benefits of treatment and long-term outcomes. I discussed how HIV is transmitted and the process of untreated HIV including increased risk for opportunistic infections, cancer, dementia and renal failure. Patient was counseled on routine HIV care including medication adherence, blood monitoring, necessary vaccines and follow up visits. Counseled regarding safe sex practices including: condom use, partner disclosure, limiting partners. Patient spent time talking with our pharmacist Cassie regarding successful practices of ART and understands to reach out to our clinic in the future with questions.   Will start BIKTARVY for HIV treatment. [facility] Will return to clinic in 4 weeks to repeat lab work and continue HIV education. Discussed briefly [facility] should he lose health insurance.   General introduction to our clinic and integrated services. Introduced to [facility] and [facility] today. Dental referral discussed - declined as he already has private service.   Flu vaccine given today - will obtained records to determine further need. Will need Prevnar at upcoming OV with pharmacy team and Pneumovax 8w later. Further TBD after record review.   I spent greater than 45 minutes with the patient today. Greater than 50% of the time spent face-to-face counseling and coordination of care re: HIV and health maintenance.

## 2018-12-12 NOTE — Telephone Encounter (Signed)
[facility] Patient Advocate Encounter   I was successful in obtaining a copay card for Biktarvy through 11/08/2019.  This copay card will make the patient's copay 0.  I have spoken with the patient.    The billing information is as follows and has been shared with [facility].   Member ID: [id number] RxBin: [id number] PCN: access Group: [id number]  Patient knows to call the office with questions or concerns.  Netty Starring. Dimas Aguas CPhT [facility] Patient [facility] Phone: [phone] Fax:  [phone]

## 2018-12-12 NOTE — Assessment & Plan Note (Signed)
Improving with nearly resolved elevated levels. This has otherwise been unexplained outside of what sounds to have been acute HIV infection in December. He has immunity to Hep A/B through childhood vaccination. No further follow up recommended aside from routine monitoring.

## 2018-12-12 NOTE — Patient Instructions (Addendum)
Wonderful to meet you today.   Biktarvy is the pill for your HIV infection - this will need to be taken once a day around the same time.  - Common side effects for a short time frame usually include headaches, nausea and diarrhea - OK to take over the counter tylenol for headaches and imodium for diarrhea - Try taking with food if you are nauseated  - please separate from multivitamins or over the counter herbal supplements by 6 hours.    Tips for Successful Daily Medication Habits: 1. Set a reminder on your phone  2. Try filling out a pill box for the week - pick a day and put one pill for every day during the week so you know right away if you missed a pill.  3. Have a trusted family member ask you about your medications.  4. Smartphone app   Please come back in 4 weeks to see our pharmacy team again to repeat some blood work.   Judeth Cornfield would like to see you back again in 2 months to check in - then we can space out your visits a little bit to every 3-4 months.

## 2018-12-12 NOTE — Progress Notes (Signed)
Name: Douglas Jimenez  DOB: Oct 15, 1996 MRN: [id number] PCP: Seward Carol, MD   Patient Active Problem List   Diagnosis Date Noted  . HIV (human immunodeficiency virus infection) ([place]) 12/12/2018  . Anemia 12/12/2018  . Transaminitis 12/12/2018     Brief Narrative:  Douglas Jimenez  is a 23 y.o. male with HIV infection, dx with acute HIV 10/2018. HLA neg. CD4 nadir 540, VL 1.5 million copies. History of OIs: none. HIV Risk: msm  Previous Regimens: . naive  Genotypes: . 11/2018 > sensitive   Subjective:  CC: New patient here to establish for HIV disease. No complaints or concerns today.   HPI:  Douglas Jimenez is here today with his parents for his first visit. He was seen in the ER 11-02-18 for fever, malaise, generalized myalgias. At this time his HIV Ab returned positive. He was also noted to have significantly elevated liver enzymes in the 300's that continued to escalate with AST 636 ALT 820 and mild hyperbilirubinemia 1.8. All work up negative including acute hepatitis panel and ultrasound. He was discharged home with instructions for outpatient follow up.   He has continued to feel well and recovered from initial febrile illness and feels completely back to normal. He reports that he has had some lumps swell in his neck that have since resolved. Diarrhea/upset stomach resolved.   In general he is a healthy young man that only carries a history of well controlled asthma. His mother reports updated vaccines as a child. No cigarette use, recreational marijuana and ETOH. Denies any other drug use. He is not presently sexually active but reports versatile MSM contact. No other hx of STIs. He has a PCP - Dr. Delfina Redwood.   Depression screen [facility] 2/9 12/12/2018  Decreased Interest 0  Down, Depressed, Hopeless 0  PHQ - 2 Score 0    Review of Systems  Constitutional: Negative for chills, fever, malaise/fatigue and weight loss.  HENT: Negative for sore throat.        No dental problems    Respiratory: Negative for cough and sputum production.   Cardiovascular: Negative for chest pain and leg swelling.  Gastrointestinal: Negative for abdominal pain, diarrhea and vomiting.  Genitourinary: Negative for dysuria and flank pain.  Musculoskeletal: Negative for joint pain, myalgias and neck pain.  Skin: Negative for rash.  Neurological: Negative for dizziness, tingling and headaches.  Psychiatric/Behavioral: Negative for depression and substance abuse. The patient is not nervous/anxious and does not have insomnia.     Past Medical History:  Diagnosis Date  . Anemia 12/12/2018  . Asthma   . HIV (human immunodeficiency virus infection) ([place]) 12/12/2018    Outpatient Medications Prior to Visit  Medication Sig Dispense Refill  . diphenhydrAMINE (BENADRYL) 25 mg capsule Take 50 mg by mouth every 6 (six) hours as needed for itching.    . Fluticasone-Salmeterol (ADVAIR DISKUS IN) Inhale 1 puff into the lungs 2 (two) times daily.     Marland Kitchen ibuprofen (ADVIL,MOTRIN) 200 MG tablet Take 400 mg by mouth every 6 (six) hours as needed for fever or moderate pain.     No facility-administered medications prior to visit.      Allergies  Allergen Reactions  . Amoxicillin     Itching   . Pecan Nut (Diagnostic) Swelling    Social History   Tobacco Use  . Smoking status: Never Smoker  . Smokeless tobacco: Never Used  Substance Use Topics  . Alcohol use: Never    Frequency: Never  . Drug use:  Yes    Types: Marijuana    No family history on file.  Social History   Substance and Sexual Activity  Sexual Activity Not Currently  . Partners: Male     Objective:   Vitals:   12/12/18 1356  BP: 131/79  Pulse: 98  Temp: 99.5 F (37.5 C)  TempSrc: Oral  Weight: 151 lb (68.5 kg)  Height: '5\' 7"'  (1.702 m)   Body mass index is 23.65 kg/m.  Physical Exam Constitutional:      Appearance: He is well-developed.     Comments: Seated comfortably in chair during visit. Parents present  and engaged.   HENT:     Mouth/Throat:     Mouth: Mucous membranes are moist.     Dentition: Normal dentition. No dental abscesses.     Pharynx: Oropharynx is clear.  Eyes:     General: No scleral icterus.    Conjunctiva/sclera: Conjunctivae normal.  Cardiovascular:     Rate and Rhythm: Normal rate and regular rhythm.     Heart sounds: Normal heart sounds.  Pulmonary:     Effort: Pulmonary effort is normal.     Breath sounds: Normal breath sounds. No wheezing.  Abdominal:     General: There is no distension.     Palpations: Abdomen is soft.     Tenderness: There is no abdominal tenderness.  Musculoskeletal: Normal range of motion.  Lymphadenopathy:     Cervical: No cervical adenopathy.  Skin:    General: Skin is warm and dry.     Findings: No rash.  Neurological:     Mental Status: He is alert and oriented to person, place, and time.  Psychiatric:        Judgment: Judgment normal.     Lab Results Lab Results  Component Value Date   WBC 6.5 11/27/2018   HGB 11.7 (L) 11/27/2018   HCT 36.2 (L) 11/27/2018   MCV 93.5 11/27/2018   PLT 326 11/27/2018    Lab Results  Component Value Date   CREATININE 0.88 11/27/2018   BUN 11 11/27/2018   NA 141 11/27/2018   K 4.4 11/27/2018   CL 107 11/27/2018   CO2 26 11/27/2018    Lab Results  Component Value Date   ALT 127 (H) 11/27/2018   AST 66 (H) 11/27/2018   ALKPHOS 95 11/10/2018   BILITOT 0.4 11/27/2018    Lab Results  Component Value Date   CHOL 156 11/27/2018   HDL 49 11/27/2018   LDLCALC 85 11/27/2018   TRIG 123 11/27/2018   CHOLHDL 3.2 11/27/2018   HIV 1 RNA Quant (copies/mL)  Date Value  11/27/2018 1,570,000 (H)   CD4 T Cell Abs (/uL)  Date Value  11/27/2018 560     Assessment & Plan:   Problem List Items Addressed This Visit      Unprioritized   HIV (human immunodeficiency virus infection) ([place]) - Primary (Chronic)    New patient here to establish for HIV care. I discussed with Douglas Jimenez  treatment options/side effects, benefits of treatment and long-term outcomes. I discussed how HIV is transmitted and the process of untreated HIV including increased risk for opportunistic infections, cancer, dementia and renal failure. Patient was counseled on routine HIV care including medication adherence, blood monitoring, necessary vaccines and follow up visits. Counseled regarding safe sex practices including: condom use, partner disclosure, limiting partners. Patient spent time talking with our pharmacist Cassie regarding successful practices of ART and understands to reach out to our clinic in  the future with questions.   Will start [place] for HIV treatment. He will receive medications from [place]. Will return to clinic in 4 weeks to repeat lab work and continue HIV education. Discussed briefly services/medical care through [facility] [place] [facility] should he lose health insurance.   General introduction to our clinic and integrated services. Introduced to [place] and [place] today. Dental referral discussed - declined as he already has private service.   Flu vaccine given today - will obtained records to determine further need. Will need Prevnar at upcoming [place] with pharmacy team and Pneumovax 8w later. Further TBD after record review.   I spent greater than 45 minutes with the patient today. Greater than 50% of the time spent face-to-face counseling and coordination of care re: HIV and health maintenance.        Relevant Medications   bictegravir-emtricitabine-tenofovir AF (BIKTARVY) 50-200-25 MG TABS tablet   Anemia    Likely d/t HIV. No signs of bleeding. Will follow on treatment.       Transaminitis    Improving with nearly resolved elevated levels. This has otherwise been unexplained outside of what sounds to have been acute HIV infection in December. He has immunity to Hep A/B through childhood vaccination. No further follow up recommended aside from routine monitoring.           Janene Madeira, MSN, NP-C [facility] for Infectious [place] Pager: [phone]  12/12/18  10:47 PM

## 2018-12-12 NOTE — Progress Notes (Signed)
HPI: Douglas Jimenez is a 23 y.o. male who presents to the [facility] clinic today to initiate care with Judeth Cornfield for his newly diagnosed HIV infection.  Patient Active Problem List   Diagnosis Date Noted  . HIV (human immunodeficiency virus infection) ([facility]) 12/12/2018    Patient's Medications  New Prescriptions   No medications on file  Previous Medications   BICTEGRAVIR-EMTRICITABINE-TENOFOVIR AF (BIKTARVY) 50-200-25 MG TABS TABLET    Take 1 tablet by mouth daily.   DIPHENHYDRAMINE (BENADRYL) 25 MG CAPSULE    Take 50 mg by mouth every 6 (six) hours as needed for itching.   FLUTICASONE-SALMETEROL (ADVAIR DISKUS IN)    Inhale 1 puff into the lungs 2 (two) times daily.    IBUPROFEN (ADVIL,MOTRIN) 200 MG TABLET    Take 400 mg by mouth every 6 (six) hours as needed for fever or moderate pain.  Modified Medications   No medications on file  Discontinued Medications   No medications on file    Allergies: Allergies  Allergen Reactions  . Amoxicillin     Itching   . Pecan Nut (Diagnostic) Swelling    Past Medical History: Past Medical History:  Diagnosis Date  . Asthma   . HIV (human immunodeficiency virus infection) ([facility]) 12/12/2018    Social History: Social History   Socioeconomic History  . Marital status: Single    Spouse name: Not on file  . Number of children: Not on file  . Years of education: Not on file  . Highest education level: Not on file  Occupational History  . Not on file  Social Needs  . Financial resource strain: Not on file  . Food insecurity:    Worry: Not on file    Inability: Not on file  . Transportation needs:    Medical: Not on file    Non-medical: Not on file  Tobacco Use  . Smoking status: Never Smoker  . Smokeless tobacco: Never Used  Substance and Sexual Activity  . Alcohol use: Never    Frequency: Never  . Drug use: Yes    Types: Marijuana  . Sexual activity: Not Currently    Partners: Male  Lifestyle  . Physical activity:   Days per week: Not on file    Minutes per session: Not on file  . Stress: Not on file  Relationships  . Social connections:    Talks on phone: Not on file    Gets together: Not on file    Attends religious service: Not on file    Active member of club or organization: Not on file    Attends meetings of clubs or organizations: Not on file    Relationship status: Not on file  Other Topics Concern  . Not on file  Social History Narrative  . Not on file    Labs: Lab Results  Component Value Date   HIV1RNAQUANT 1,570,000 (H) 11/27/2018   CD4TABS 560 11/27/2018    RPR and STI Lab Results  Component Value Date   LABRPR NON-REACTIVE 11/27/2018    STI Results GC CT  11/27/2018 Negative Negative    Hepatitis B Lab Results  Component Value Date   HEPBSAB REACTIVE (A) 11/27/2018   HEPBSAG NON-REACTIVE 11/27/2018   HEPBCAB NON-REACTIVE 11/27/2018   Hepatitis C Lab Results  Component Value Date   HEPCAB NON-REACTIVE 11/27/2018   Hepatitis A Lab Results  Component Value Date   HAV REACTIVE (A) 11/27/2018   Lipids: Lab Results  Component Value Date   CHOL  156 11/27/2018   TRIG 123 11/27/2018   HDL 49 11/27/2018   CHOLHDL 3.2 11/27/2018   LDLCALC 85 11/27/2018    Current HIV Regimen: Treatment naive  Assessment: Douglas Jimenez is here today to initiate care with Judeth Cornfield for his newly diagnosed HIV infection.  He is treatment naive with an initial HIV viral load of 1.5 million and a CD4 count of 560.  Will start patient on Biktarvy.  Explained that Susanne Borders is a one pill once daily medication with or without food and the importance of not missing any doses. Explained resistance and how it develops and why it is so important to take Biktarvy daily and not skip days or doses. Counseled patient to take it around the same time each day. Counseled on what to do if dose is missed, if closer to missed dose take immediately, if closer to next dose then skip and resume normal  schedule.   Cautioned on possible side effects the first week or so including nausea, diarrhea, dizziness, and headaches but that they should resolve after the first couple of weeks. I reviewed patient medications and found no drug interactions. Counseled patient to separate Biktarvy from divalent cations including multivitamins. Discussed with patient to call clinic if he starts a new medication or herbal supplement. I gave the patient my card and told him to call me with any issues/questions/concerns.  Patient has [employer] and will pick up at [facility].   Plan: - Start Biktarvy PO once daily  Douglas Jimenez L. Paralee Pendergrass, PharmD, BCIDP, AAHIVP, CPP Infectious Diseases Clinical Pharmacist [facility] 12/12/2018, 3:30 PM

## 2018-12-12 NOTE — Assessment & Plan Note (Signed)
Likely d/t HIV. No signs of bleeding. Will follow on treatment.

## 2018-12-14 MED FILL — ADVAIR 100/50 DISKUS: 100-50 | 30 days supply | Qty: 60 | Fill #1 | Status: TO

## 2019-01-05 MED FILL — BIKTARVY 50-200-25 MG TABS: 50-200-25 | 30 days supply | Qty: 30 | Fill #1

## 2019-01-09 ENCOUNTER — Encounter: Payer: Self-pay | Admitting: Infectious Diseases

## 2019-02-02 MED FILL — BIKTARVY 50-200-25 MG TABS: 50-200-25 | 30 days supply | Qty: 30 | Fill #2

## 2019-02-09 MED FILL — ADVAIR 100/50 DISKUS: 100-50 | 30 days supply | Qty: 60 | Fill #0

## 2019-02-15 ENCOUNTER — Ambulatory Visit (INDEPENDENT_AMBULATORY_CARE_PROVIDER_SITE_OTHER): Payer: No Typology Code available for payment source | Admitting: Infectious Diseases

## 2019-02-15 ENCOUNTER — Other Ambulatory Visit: Payer: Self-pay

## 2019-02-15 VITALS — BP 152/72 | HR 87 | Temp 98.5°F | Wt 151.0 lb

## 2019-02-15 DIAGNOSIS — B20 Human immunodeficiency virus [HIV] disease: Secondary | ICD-10-CM | POA: Diagnosis not present

## 2019-02-15 DIAGNOSIS — R740 Nonspecific elevation of levels of transaminase and lactic acid dehydrogenase [LDH]: Secondary | ICD-10-CM

## 2019-02-15 DIAGNOSIS — Z7185 Encounter for immunization safety counseling: Secondary | ICD-10-CM

## 2019-02-15 DIAGNOSIS — R7401 Elevation of levels of liver transaminase levels: Secondary | ICD-10-CM

## 2019-02-15 DIAGNOSIS — Z7189 Other specified counseling: Secondary | ICD-10-CM

## 2019-02-15 DIAGNOSIS — Z21 Asymptomatic human immunodeficiency virus [HIV] infection status: Secondary | ICD-10-CM

## 2019-02-15 NOTE — Progress Notes (Signed)
Name: Douglas Jimenez  DOB: [birth year] MRN: [id number] PCP: Douglas Carol, MD   Patient Active Problem List   Diagnosis Date Noted  . Vaccine counseling 02/20/2019  . HIV (human immunodeficiency virus infection) ([place]) 12/12/2018  . Anemia 12/12/2018     Brief Narrative:  Douglas Jimenez  is a 23 y.o. male with HIV infection, dx with acute HIV 10/2018. HLA neg. CD4 nadir 540, VL 1.5 million copies.  History of OIs: none.  HIV Risk: msm  Previous Regimens: . Biktarvy  Genotypes: . 11/2018 > sensitive   Subjective:  CC: Routine follow-up for HIV care.  No complaints or concerns.  HPI:  Douglas Jimenez is here for routine follow-up.  He reports excellent adherence with his medications.  He has not noticed any side effects from his new medication.  Reports taking this every day with no missed doses.  He does not feel much different compared to before medication use.  Overall feels well.  Currently not sexually active.  He has done further research regarding HIV and has educated himself since his last office visit.  He has access to his MyChart account however does not recall the details about his lab work.  Review of Systems  Constitutional: Negative for chills, fever, malaise/fatigue and weight loss.  HENT: Negative for sore throat.        No dental problems  Respiratory: Negative for cough and sputum production.   Cardiovascular: Negative for chest pain and leg swelling.  Gastrointestinal: Negative for abdominal pain, diarrhea and vomiting.  Genitourinary: Negative for dysuria and flank pain.  Musculoskeletal: Negative for joint pain, myalgias and neck pain.  Skin: Negative for rash.  Neurological: Negative for dizziness, tingling and headaches.  Psychiatric/Behavioral: Negative for depression and substance abuse. The patient is not nervous/anxious and does not have insomnia.     Past Medical History:  Diagnosis Date  . Anemia 12/12/2018  . Asthma   . HIV (human  immunodeficiency virus infection) ([place]) 12/12/2018    Outpatient Medications Prior to Visit  Medication Sig Dispense Refill  . bictegravir-emtricitabine-tenofovir AF (BIKTARVY) 50-200-25 MG TABS tablet Take 1 tablet by mouth daily. 30 tablet 5  . Fluticasone-Salmeterol (ADVAIR DISKUS IN) Inhale 1 puff into the lungs 2 (two) times daily.     Marland Kitchen ibuprofen (ADVIL,MOTRIN) 200 MG tablet Take 400 mg by mouth every 6 (six) hours as needed for fever or moderate pain.    . diphenhydrAMINE (BENADRYL) 25 mg capsule Take 50 mg by mouth every 6 (six) hours as needed for itching.     No facility-administered medications prior to visit.      Allergies  Allergen Reactions  . Amoxicillin     Itching   . Pecan Nut (Diagnostic) Swelling    Social History   Tobacco Use  . Smoking status: Never Smoker  . Smokeless tobacco: Never Used  Substance Use Topics  . Alcohol use: Never    Frequency: Never  . Drug use: Yes    Types: Marijuana    No family history on file.  Social History   Substance and Sexual Activity  Sexual Activity Not Currently  . Partners: Male     Objective:   Vitals:   02/15/19 1055  BP: (!) 152/72  Pulse: 87  Temp: 98.5 F (36.9 C)  TempSrc: Oral  Weight: 151 lb (68.5 kg)   Body mass index is 23.65 kg/m.  Physical Exam Constitutional:      Appearance: He is well-developed.     Comments: Seated comfortably  in chair during visit. Parents present and engaged.   HENT:     Mouth/Throat:     Mouth: Mucous membranes are moist.     Dentition: Normal dentition. No dental abscesses.     Pharynx: Oropharynx is clear.  Eyes:     General: No scleral icterus.    Conjunctiva/sclera: Conjunctivae normal.  Cardiovascular:     Rate and Rhythm: Normal rate and regular rhythm.     Heart sounds: Normal heart sounds.  Pulmonary:     Effort: Pulmonary effort is normal.     Breath sounds: Normal breath sounds. No wheezing.  Abdominal:     General: There is no distension.      Palpations: Abdomen is soft.     Tenderness: There is no abdominal tenderness.  Musculoskeletal: Normal range of motion.  Lymphadenopathy:     Cervical: No cervical adenopathy.  Skin:    General: Skin is warm and dry.     Findings: No rash.  Neurological:     Mental Status: He is alert and oriented to person, place, and time.  Psychiatric:        Judgment: Judgment normal.     Lab Results Lab Results  Component Value Date   WBC 6.5 11/27/2018   HGB 11.7 (L) 11/27/2018   HCT 36.2 (L) 11/27/2018   MCV 93.5 11/27/2018   PLT 326 11/27/2018    Lab Results  Component Value Date   CREATININE 0.88 11/27/2018   BUN 11 11/27/2018   NA 141 11/27/2018   K 4.4 11/27/2018   CL 107 11/27/2018   CO2 26 11/27/2018    Lab Results  Component Value Date   ALT 127 (H) 11/27/2018   AST 66 (H) 11/27/2018   ALKPHOS 95 11/10/2018   BILITOT 0.4 11/27/2018    Lab Results  Component Value Date   CHOL 156 11/27/2018   HDL 49 11/27/2018   LDLCALC 85 11/27/2018   TRIG 123 11/27/2018   CHOLHDL 3.2 11/27/2018   HIV 1 RNA Quant (copies/mL)  Date Value  02/15/2019 78 (H)  11/27/2018 1,570,000 (H)   CD4 T Cell Abs (/uL)  Date Value  02/15/2019 730  11/27/2018 560     Assessment & Plan:   Problem List Items Addressed This Visit      Unprioritized   HIV (human immunodeficiency virus infection) ([place]) (Chronic)    Douglas Jimenez is doing well early into his HIV treatment.  He seems to be tolerating the [place] well and taking it correctly.  We will repeat his viral load and CD4 count for education and reinforcement that his medicine is working well.  I do not suspect he will be completely undetectable yet considering his viral load of over 1-1/2 million copies. Advised to continue abstaining through sexual activity or strict condom use. Discussed U=U concept in addition to safe sex counseling and prevention of other STIs.   Return in about 3 months (around 05/17/2019).       Vaccine  counseling    He is leery about vaccines.  We discussed recommendations regarding Prevnar, Pneumovax schedule.  Would also like to finish his HPV series.  He received 1 injection which may offer him some immunity.  He is immune to hepatitis A and B.  He will need Menveo injections as well.  I provided him with information about the pneumonia vaccines so he can research further and decide.      RESOLVED: Transaminitis    Resolved.  Other Visit Diagnoses    HIV disease ([place])    -  Primary   Relevant Orders   HIV-1 RNA quant-no reflex-bld (Completed)   T-helper cell (CD4)- ([facility] clinic only) (Completed)      Janene Madeira, MSN, NP-C [place] for Infectious [place] Pager: [phone]  02/20/19  3:13 PM

## 2019-02-15 NOTE — Patient Instructions (Addendum)
Very nice to see you today.  Please continue taking the Biktarvy every day as you are.  We will help you set your MyChart account up and put on your phone so that way you could see your lab results, send any questions my way through secure email portal, even schedule an appointment without having to call.  Please continue to abstain from sex or use street condoms during encounters while we work on getting you undetectable.  Please come back to see Judeth Cornfield in 3 months.  Vaccines I would recommend,  1.  Pneumonia vaccines 2.  Meningitis vaccine 3. Finishing your HPV vaccine  MyChart Password:  [email or web address]

## 2019-02-16 LAB — T-HELPER CELL (CD4) - (RCID CLINIC ONLY)
CD4 % Helper T Cell: 34 % (ref 33–55)
CD4 T Cell Abs: 730 /uL (ref 400–2700)

## 2019-02-20 ENCOUNTER — Encounter: Payer: Self-pay | Admitting: Infectious Diseases

## 2019-02-20 DIAGNOSIS — Z7185 Encounter for immunization safety counseling: Secondary | ICD-10-CM | POA: Insufficient documentation

## 2019-02-20 DIAGNOSIS — Z7189 Other specified counseling: Secondary | ICD-10-CM | POA: Insufficient documentation

## 2019-02-20 LAB — HIV-1 RNA QUANT-NO REFLEX-BLD
HIV 1 RNA Quant: 78 copies/mL — ABNORMAL HIGH
HIV-1 RNA Quant, Log: 1.89 Log copies/mL — ABNORMAL HIGH

## 2019-02-20 NOTE — Assessment & Plan Note (Signed)
Douglas Jimenez is doing well early into Douglas Jimenez HIV treatment.  He seems to be tolerating the [place] well and taking it correctly.  We will repeat Douglas Jimenez viral load and CD4 count for education and reinforcement that Douglas Jimenez medicine is working well.  I do not suspect he will be completely undetectable yet considering Douglas Jimenez viral load of over 1-1/2 million copies. Advised to continue abstaining through sexual activity or strict condom use. Discussed U=U concept in addition to safe sex counseling and prevention of other STIs.   Return in about 3 months (around 05/17/2019).

## 2019-02-20 NOTE — Assessment & Plan Note (Signed)
Resolved

## 2019-02-20 NOTE — Assessment & Plan Note (Addendum)
He is leery about vaccines.  We discussed recommendations regarding Prevnar, Pneumovax schedule.  Would also like to finish his HPV series.  He received 1 injection which may offer him some immunity.  He is immune to hepatitis A and B.  He will need Menveo injections as well.  I provided him with information about the pneumonia vaccines so he can research further and decide.

## 2019-03-05 MED FILL — ADVAIR 100/50 DISKUS: 100-50 | 30 days supply | Qty: 60 | Fill #1

## 2019-03-09 MED FILL — BIKTARVY 50-200-25 MG TABS: 50-200-25 | 30 days supply | Qty: 30 | Fill #3

## 2019-04-11 MED FILL — BIKTARVY 50-200-25 MG TABS: 50-200-25 | 30 days supply | Qty: 30 | Fill #4

## 2019-04-30 ENCOUNTER — Telehealth: Payer: Self-pay | Admitting: Infectious Diseases

## 2019-04-30 NOTE — Telephone Encounter (Signed)
COVID-19 Pre-Screening Questions: ° °Do you currently have a fever (>100 °F), chills or unexplained body aches? No  ° °Are you currently experiencing new cough, shortness of breath, sore throat, runny nose?no   °•  °Have you recently travelled outside the state of [place] in the last 14 days? No  °•  °1. Have you been in contact with someone that is currently pending confirmation of Covid19 testing or has been confirmed to have the Covid19 virus?  No  ° °

## 2019-05-01 ENCOUNTER — Ambulatory Visit: Payer: No Typology Code available for payment source | Admitting: Infectious Diseases

## 2019-05-07 ENCOUNTER — Ambulatory Visit: Payer: No Typology Code available for payment source | Admitting: Infectious Diseases

## 2019-05-09 DEATH — deceased

## 2020-10-18 IMAGING — DX DG CHEST 2V
2 series · 2 of 2 positions shown · non-contrast
Comparison: None.

CLINICAL DATA: Fever for 2 weeks, abdominal pain and headache.

EXAM:
CHEST - 2 VIEW

[chest pa]
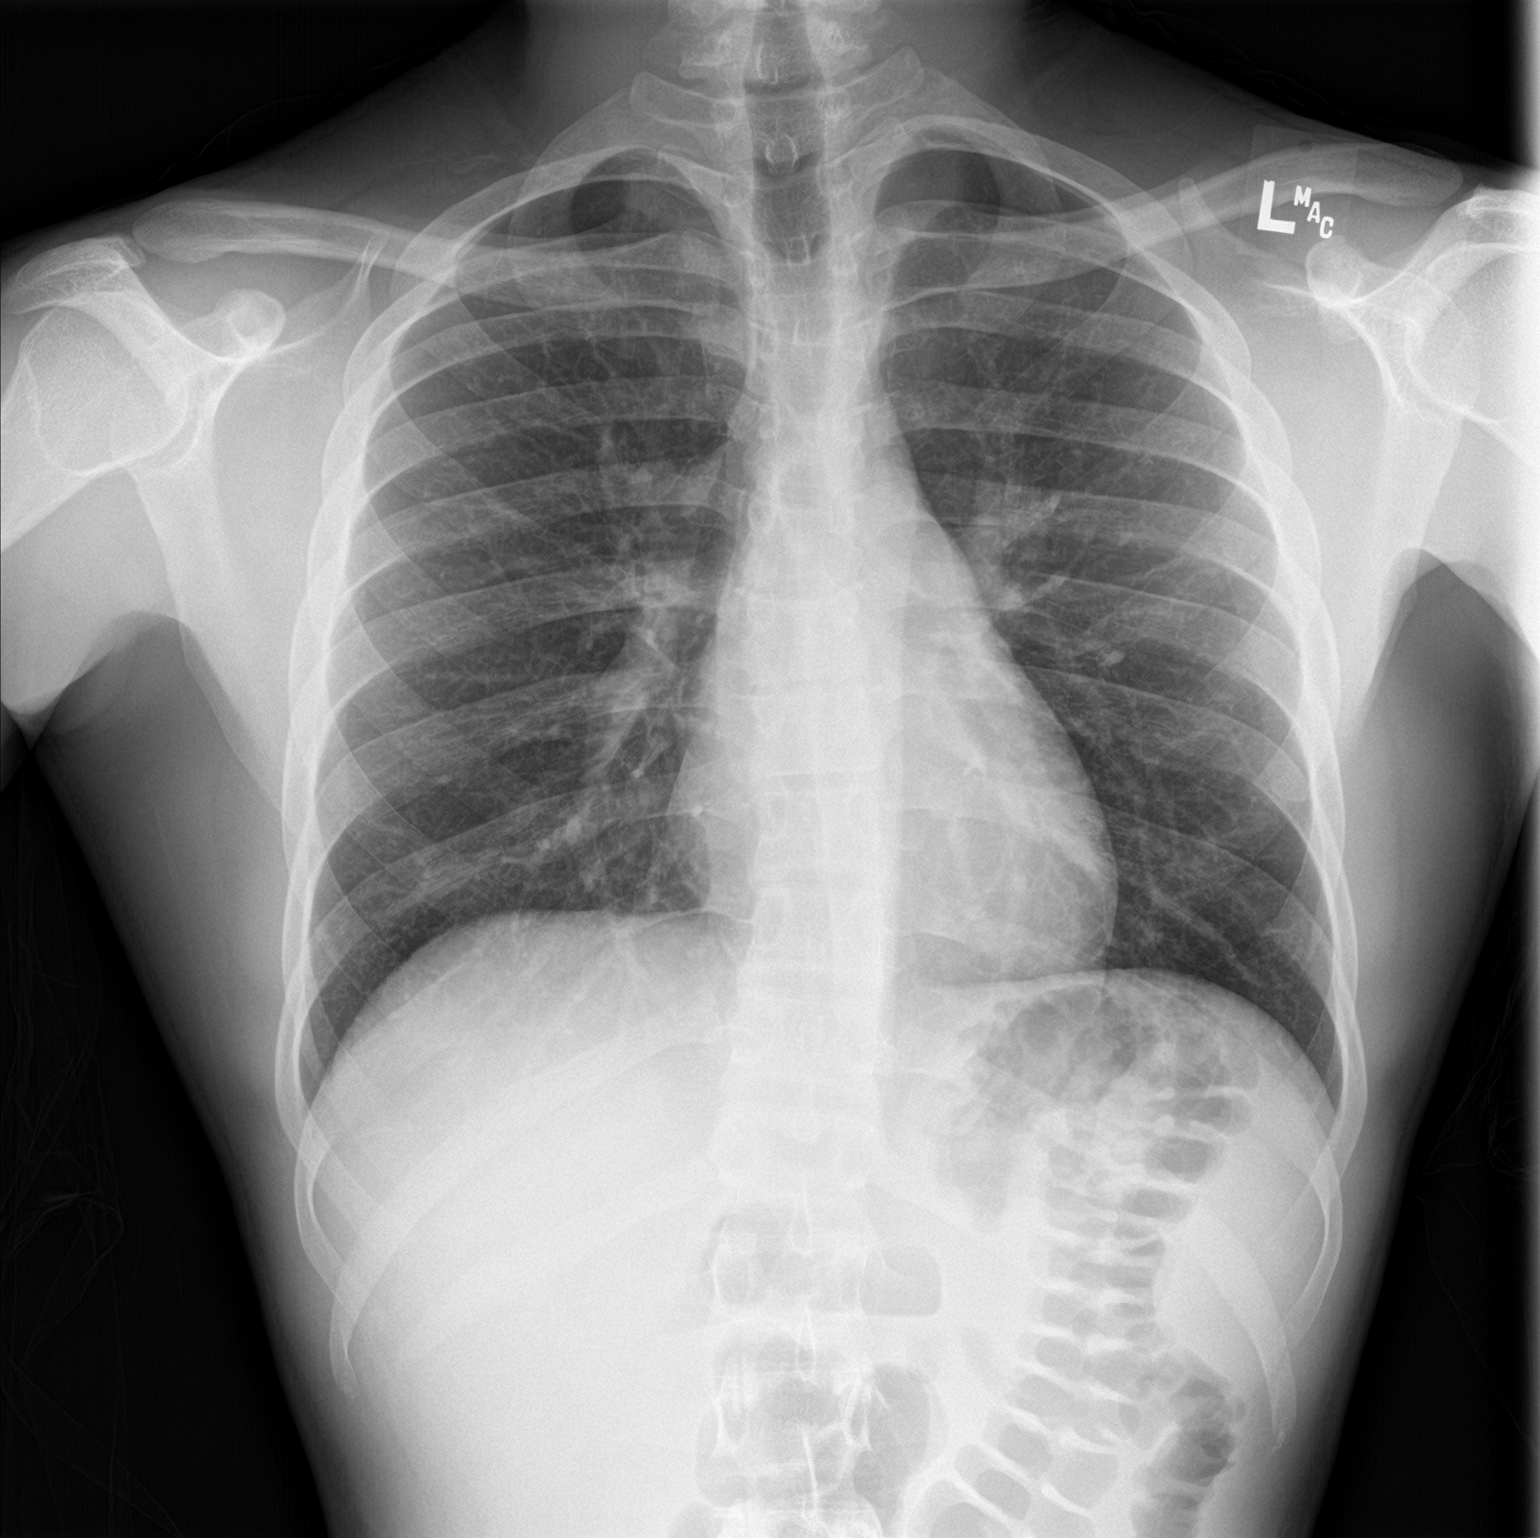

[chest lat]
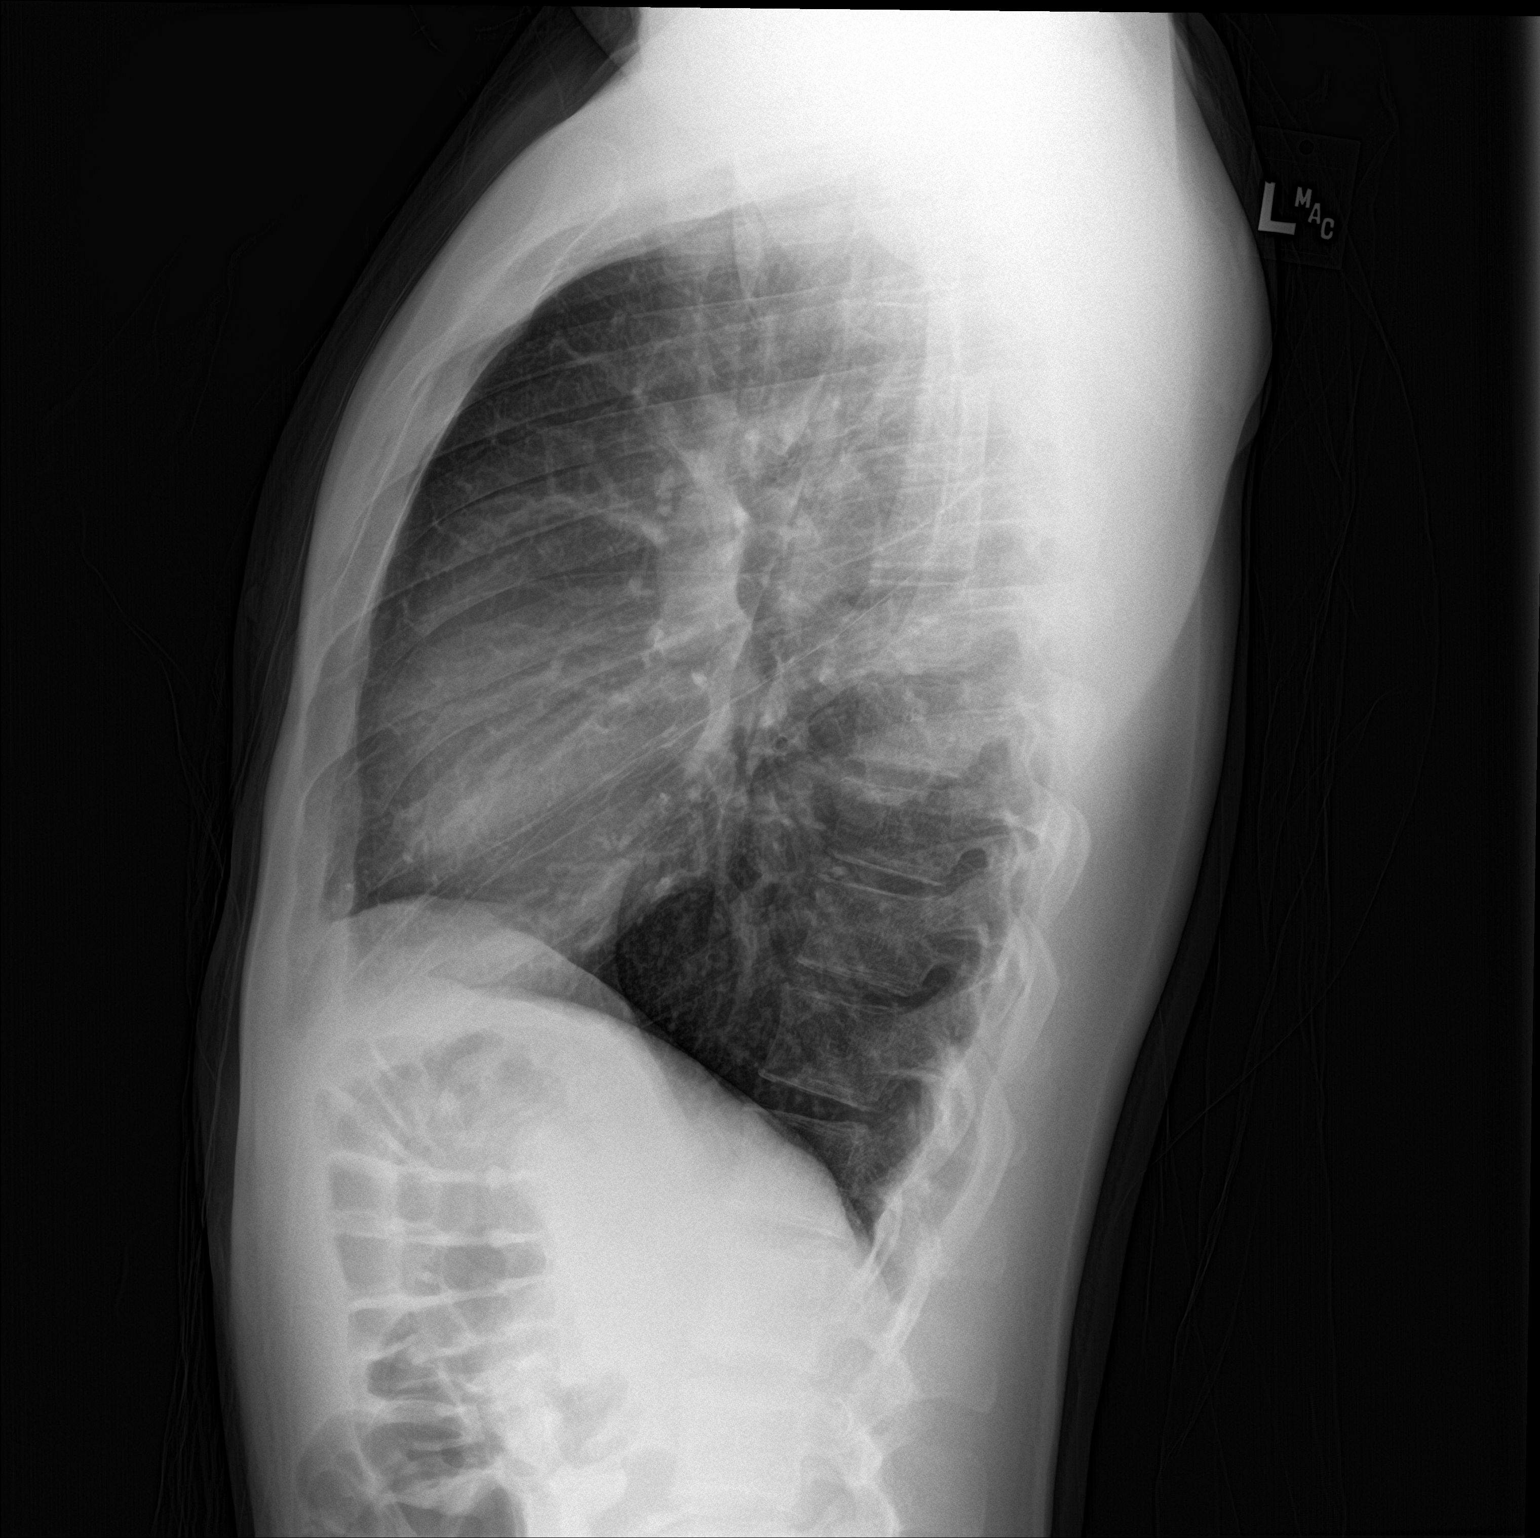

[2 of 2 positions shown; findings below may reference images not displayed]

FINDINGS: Cardiomediastinal silhouette is normal. No pleural effusions or
focal consolidations. Trachea projects midline and there is no
pneumothorax. Soft tissue planes and included osseous structures are
non-suspicious. Mild lower thoracic levoscoliosis.
IMPRESSION: Negative.

## 2020-10-25 IMAGING — CR DG ABDOMEN 2V
2 series · 2 of 2 positions shown · non-contrast
Comparison: None.

CLINICAL DATA: Acute generalized abdominal pain.

EXAM:
ABDOMEN - 2 VIEW

[w abdomen upright *]
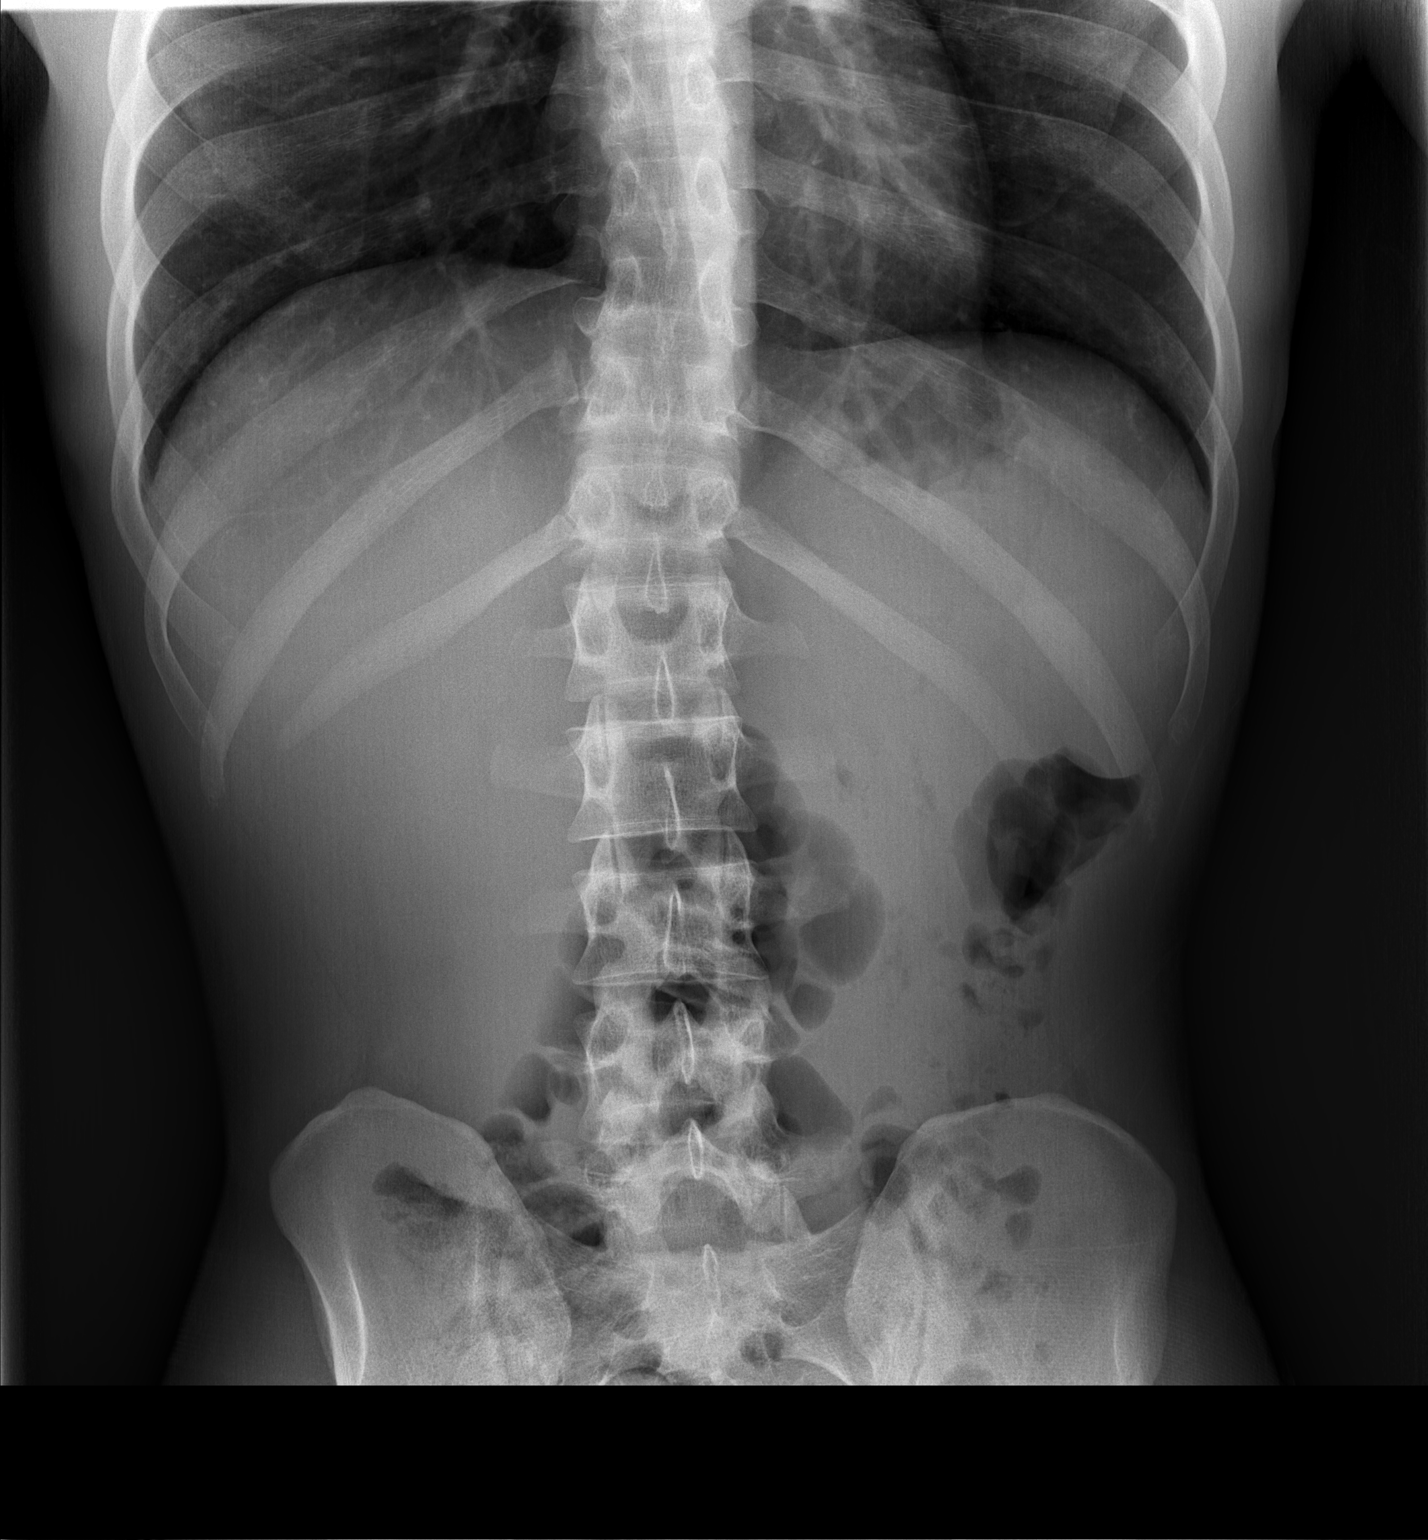

[t abdomen supine]
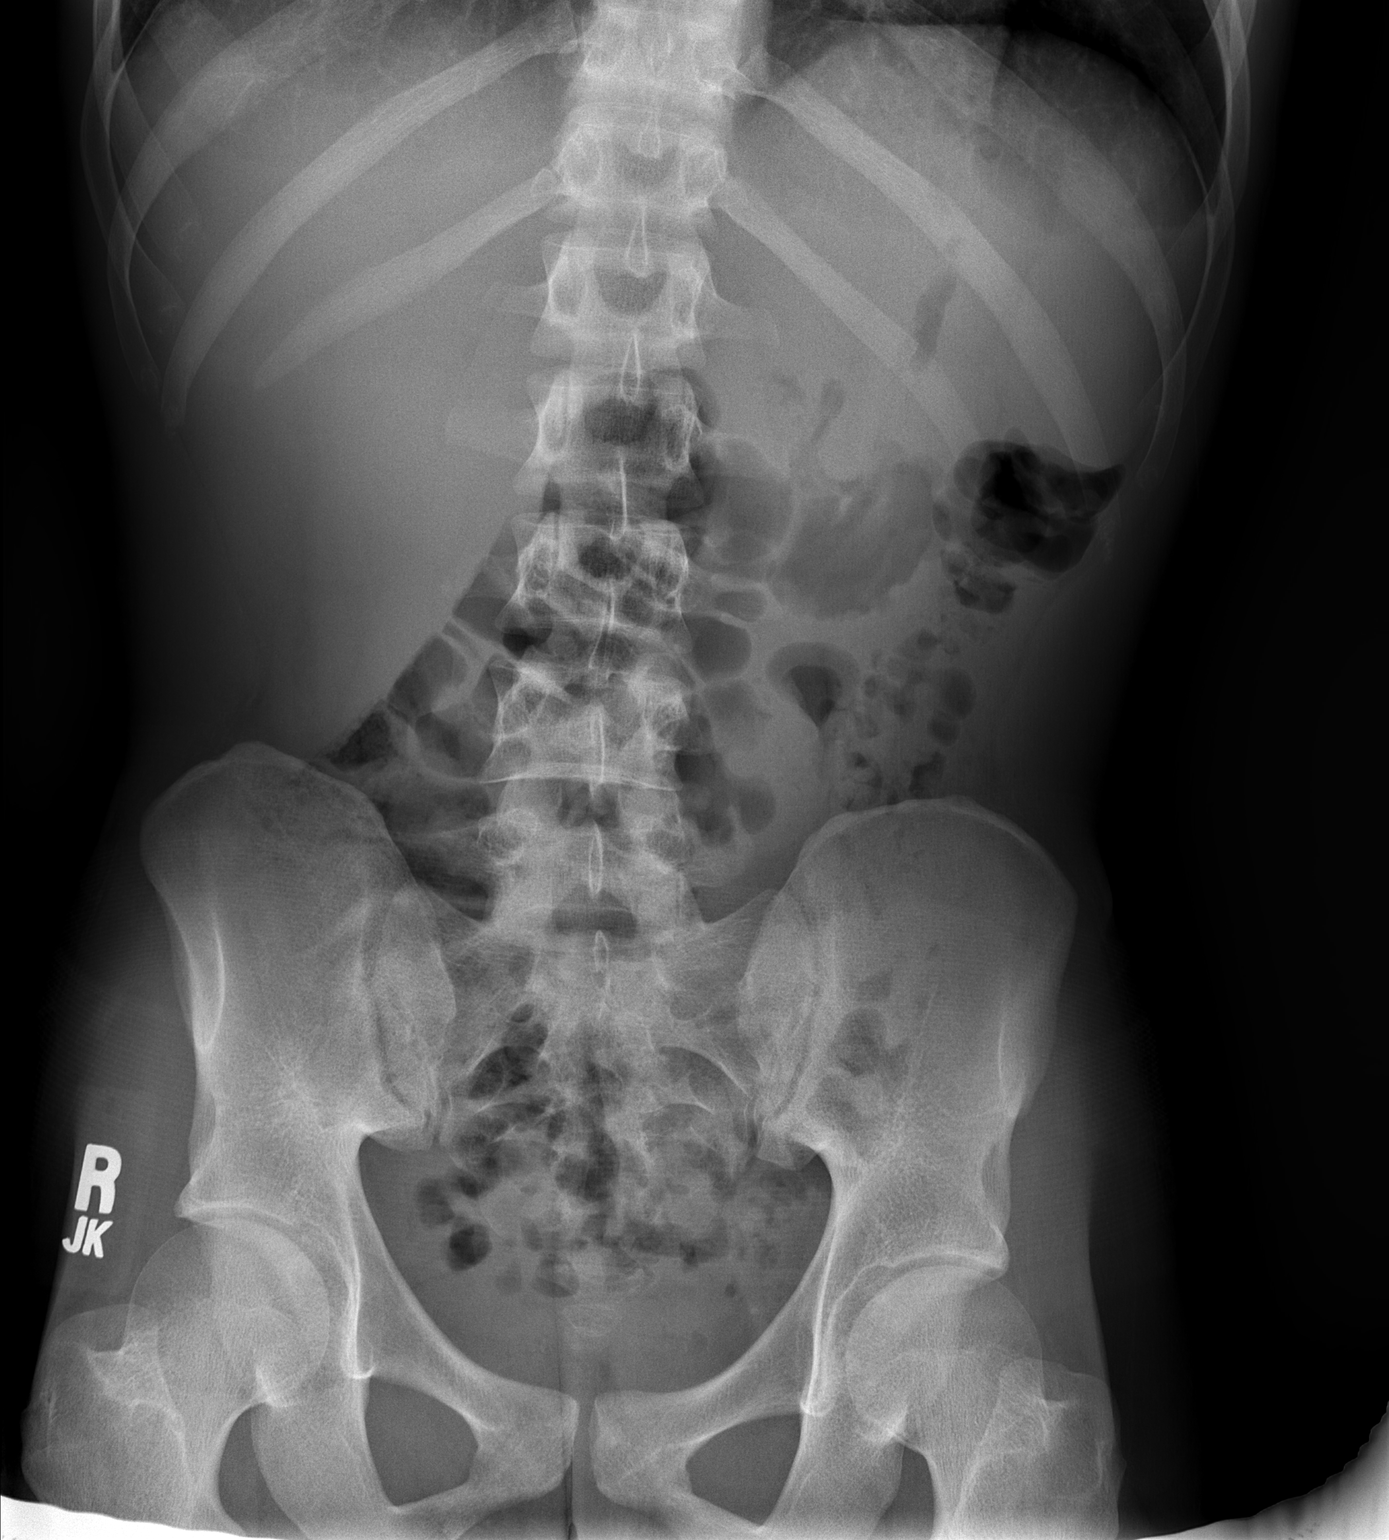

[2 of 2 positions shown; findings below may reference images not displayed]

FINDINGS: The bowel gas pattern is normal. There is no evidence of free air.
No radio-opaque calculi or other significant radiographic
abnormality is seen.
IMPRESSION: No evidence of bowel obstruction or ileus.

## 2020-10-26 IMAGING — US US ABDOMEN LIMITED
1 series · 14 of 25 positions shown · non-contrast
Comparison: None.

CLINICAL DATA: Transaminitis.

EXAM:
ULTRASOUND ABDOMEN LIMITED RIGHT UPPER QUADRANT

[Series 1: us abdomen limited · 14 of 52 slices shown]
[im 1/52]
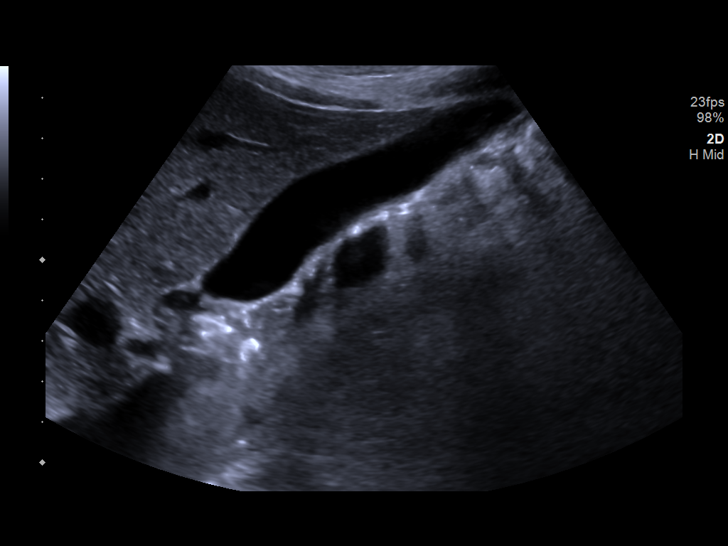
[im 5/52]
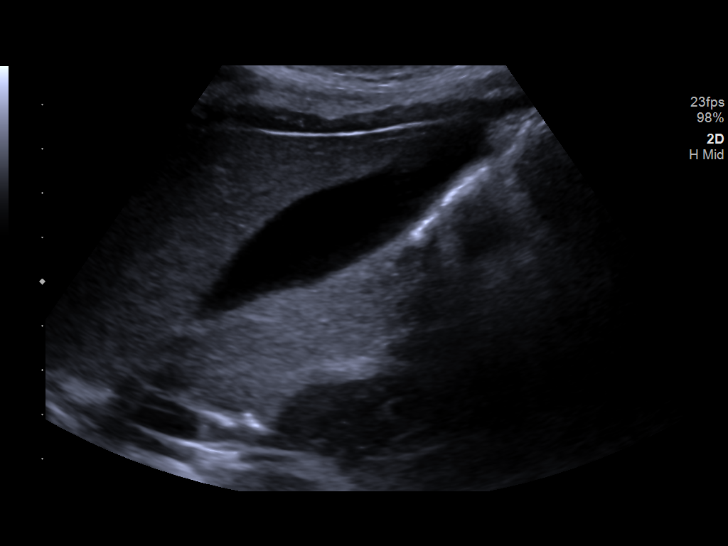
[im 9/52]
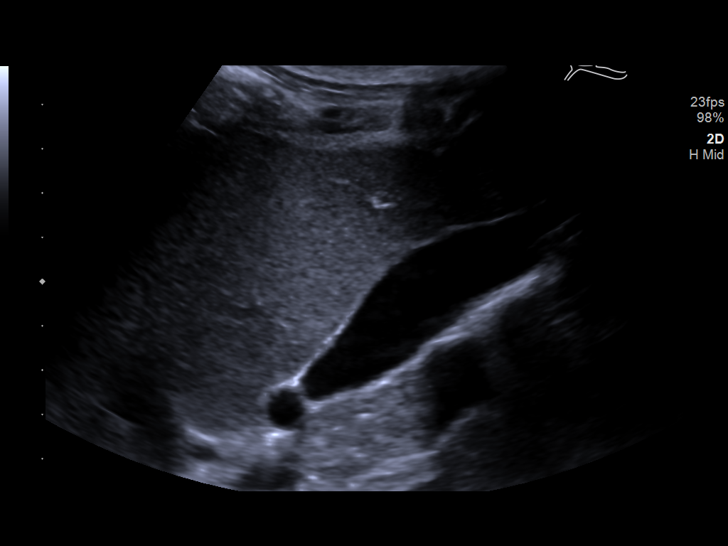
[im 13/52]
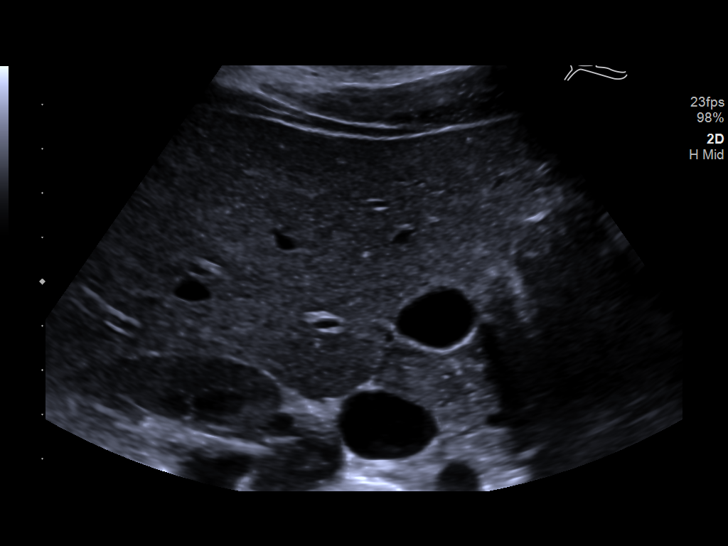
[im 18/52]
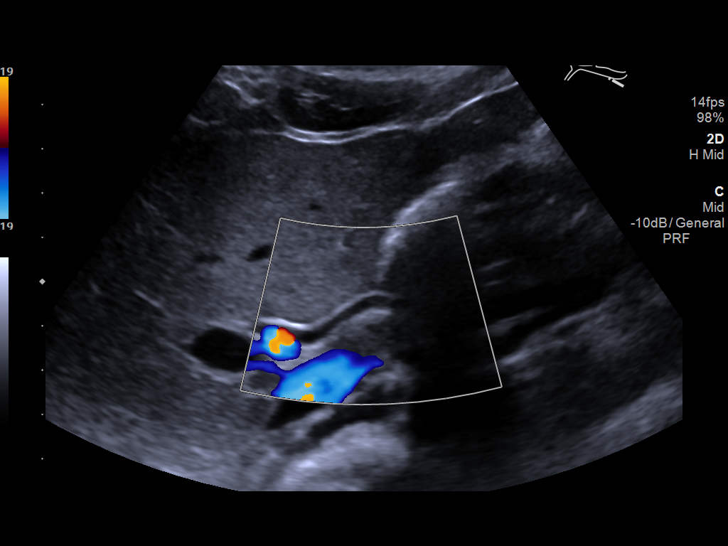
[im 20/52]
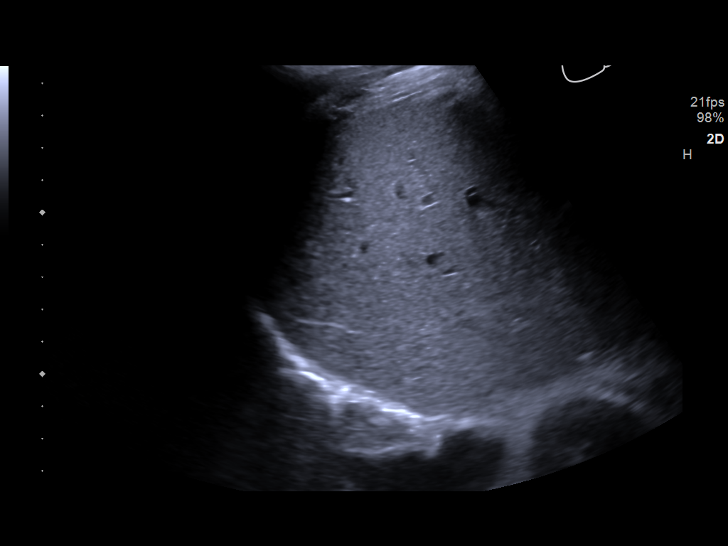
[im 24/52]
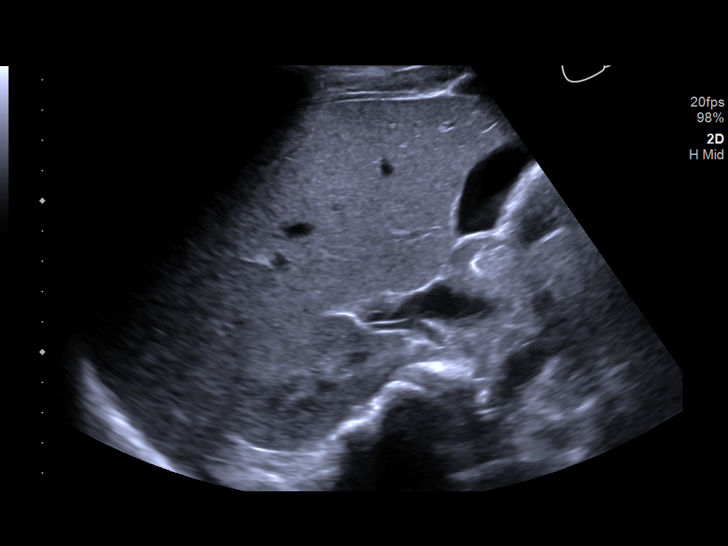
[im 28/52]
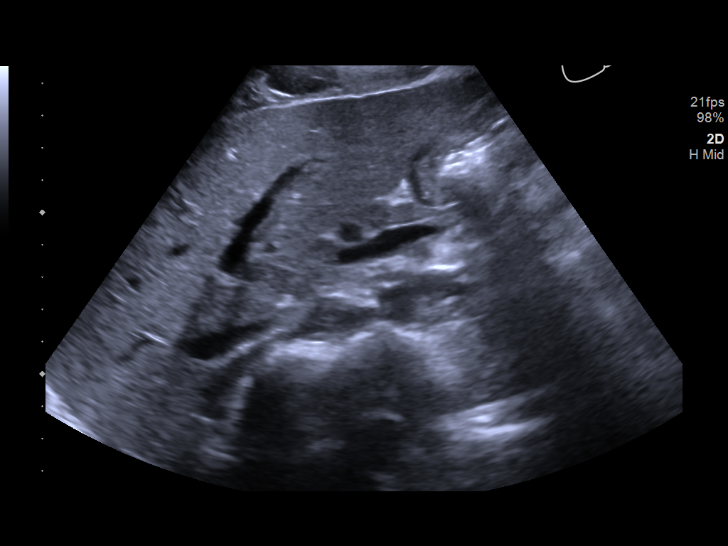
[im 32/52]
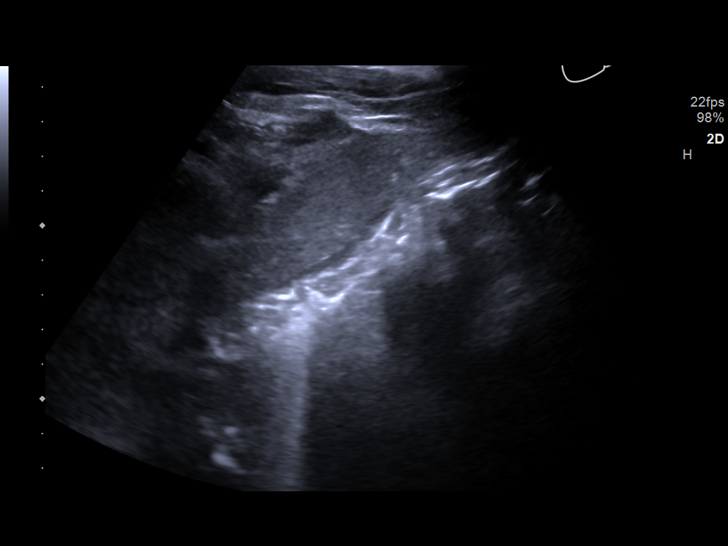
[im 35/52]
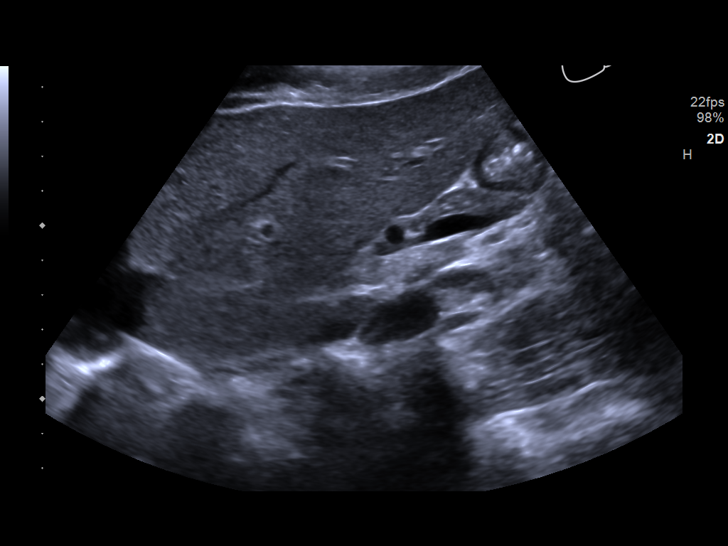
[im 39/52]
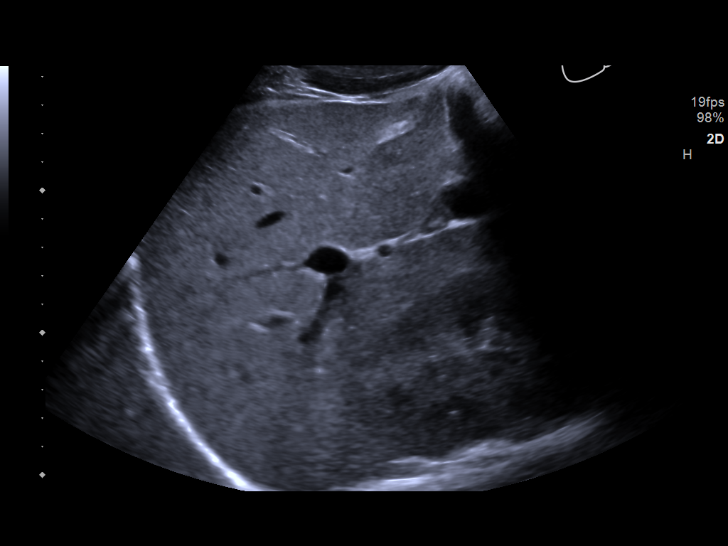
[im 43/52]
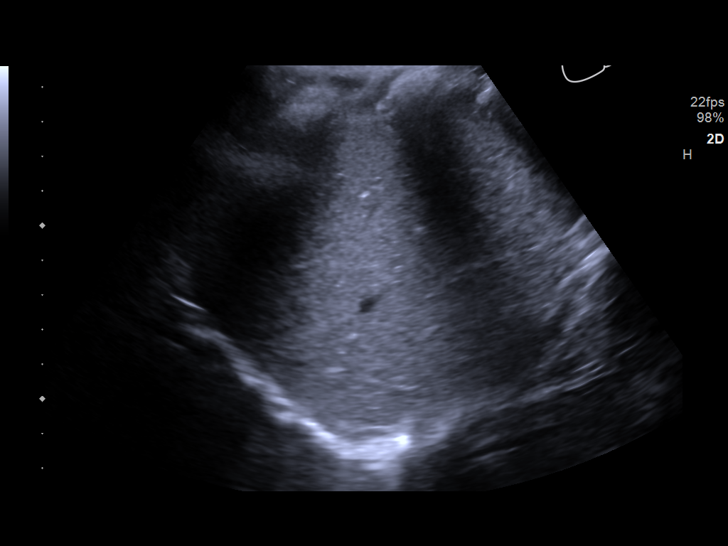
[im 47/52]
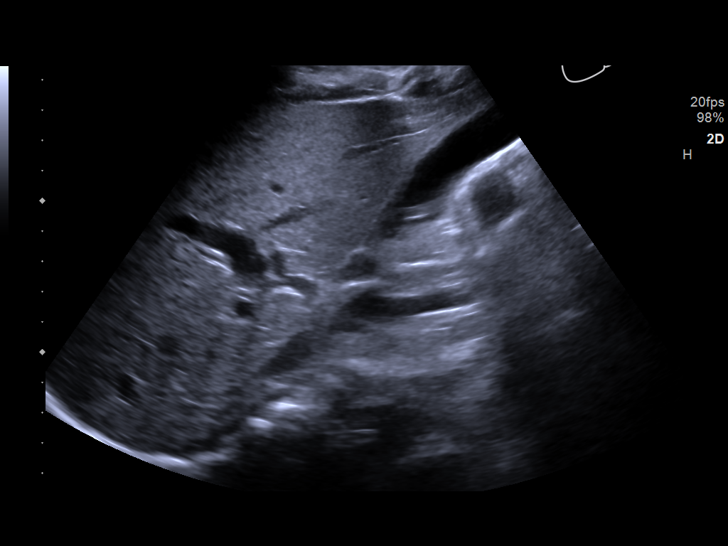
[im 52/52]
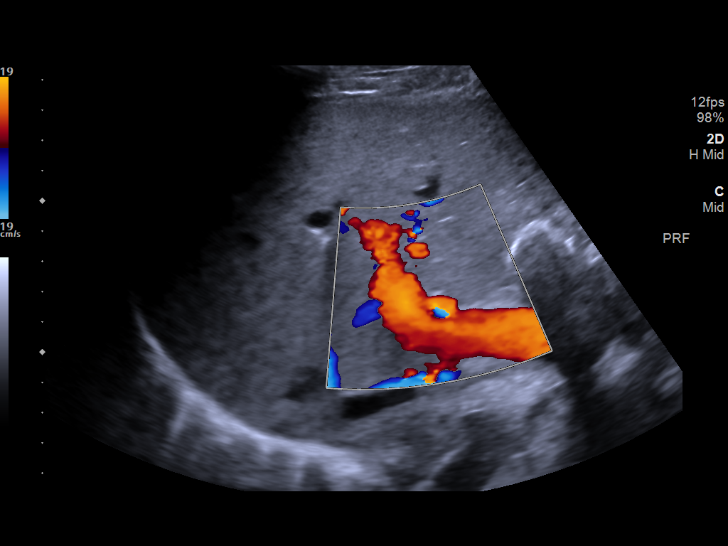

[14 of 25 positions shown; findings below may reference images not displayed]

FINDINGS: Gallbladder:

No gallstones or wall thickening visualized. No sonographic Murphy
sign noted by sonographer.

Common bile duct:

Diameter: 2.8 mm, normal.

Liver:

No focal lesion identified. Within normal limits in parenchymal
echogenicity. Portal vein is patent on color Doppler imaging with
normal direction of blood flow towards the liver.
IMPRESSION: Normal exam.
